# Patient Record
Sex: Male | Born: 1985 | Race: White | Hispanic: No | Marital: Single | Smoking: Never smoker
Health system: Southern US, Community
[De-identification: ages and names within clinical notes are randomized; demographics above are authoritative.]

## PROBLEM LIST (undated history)

## (undated) DIAGNOSIS — M549 Dorsalgia, unspecified: Secondary | ICD-10-CM

## (undated) DIAGNOSIS — G8929 Other chronic pain: Secondary | ICD-10-CM

## (undated) DIAGNOSIS — F1110 Opioid abuse, uncomplicated: Secondary | ICD-10-CM

## (undated) HISTORY — PX: NO PAST SURGERIES: SHX2092

## (undated) HISTORY — PX: WISDOM TOOTH EXTRACTION: SHX21

## (undated) HISTORY — DX: Other chronic pain: G89.29

## (undated) HISTORY — DX: Dorsalgia, unspecified: M54.9

---

## 2011-03-16 ENCOUNTER — Emergency Department (HOSPITAL_COMMUNITY)
Admission: EM | Admit: 2011-03-16 | Discharge: 2011-03-16 | Disposition: A | Discharge status: Home / Self Care | Payer: Self-pay | Attending: Emergency Medicine | Admitting: Emergency Medicine

## 2011-03-16 DIAGNOSIS — K089 Disorder of teeth and supporting structures, unspecified: Secondary | ICD-10-CM | POA: Insufficient documentation

## 2011-03-16 DIAGNOSIS — K006 Disturbances in tooth eruption: Secondary | ICD-10-CM | POA: Insufficient documentation

## 2011-07-10 ENCOUNTER — Emergency Department (HOSPITAL_COMMUNITY): Admission: EM | Admit: 2011-07-10 | Discharge: 2011-07-10 | Discharge status: Against Medical Advice | Payer: Self-pay

## 2013-11-04 ENCOUNTER — Encounter (HOSPITAL_COMMUNITY): Payer: Self-pay | Admitting: Emergency Medicine

## 2013-11-04 ENCOUNTER — Emergency Department (HOSPITAL_COMMUNITY)
Admission: EM | Admit: 2013-11-04 | Discharge: 2013-11-04 | Disposition: A | Discharge status: Home / Self Care | Payer: Medicaid Other | Attending: Emergency Medicine | Admitting: Emergency Medicine

## 2013-11-04 DIAGNOSIS — J329 Chronic sinusitis, unspecified: Secondary | ICD-10-CM | POA: Insufficient documentation

## 2013-11-04 DIAGNOSIS — M546 Pain in thoracic spine: Secondary | ICD-10-CM | POA: Insufficient documentation

## 2013-11-04 DIAGNOSIS — R1110 Vomiting, unspecified: Secondary | ICD-10-CM | POA: Insufficient documentation

## 2013-11-04 DIAGNOSIS — G8929 Other chronic pain: Secondary | ICD-10-CM | POA: Insufficient documentation

## 2013-11-04 MED ORDER — AMOXICILLIN 500 MG PO CAPS
500.0000 mg | ORAL_CAPSULE | Freq: Once | ORAL | Status: AC
  Administered 2013-11-04: 500 mg via ORAL
  Filled 2013-11-04: qty 1

## 2013-11-04 MED ORDER — AMOXICILLIN 500 MG PO CAPS: 500.0000 mg | ORAL_CAPSULE | Freq: Three times a day (TID) | ORAL | Status: DC

## 2013-11-04 MED ORDER — FLUTICASONE PROPIONATE HFA 44 MCG/ACT IN AERO
2.0000 | INHALATION_SPRAY | Freq: Once | RESPIRATORY_TRACT | Status: AC
  Administered 2013-11-04: 2 via RESPIRATORY_TRACT
  Filled 2013-11-04: qty 10.6

## 2013-11-04 MED ORDER — TRAMADOL HCL 50 MG PO TABS
50.0000 mg | ORAL_TABLET | Freq: Four times a day (QID) | ORAL | Status: DC | PRN
Start: 2013-11-04 — End: 2014-05-12

## 2013-11-04 MED ORDER — TRAMADOL HCL 50 MG PO TABS
50.0000 mg | ORAL_TABLET | Freq: Once | ORAL | Status: AC
  Administered 2013-11-04: 50 mg via ORAL
  Filled 2013-11-04: qty 1

## 2013-11-04 NOTE — ED Provider Notes (Signed)
CSN: [id number]     Arrival date & time 11/04/13  2104 History  This chart was scribed for non-physician practitioner Wynetta Emery, PA-C working with Layla Maw Ward, DO by Danella Maiers, ED Scribe. This patient was seen in room TR11C/TR11C and the patient's care was started at 10:01 PM.    Chief Complaint  Patient presents with  . Nasal Congestion   The history is provided by the patient. No language interpreter was used.   HPI Comments: Collin Beasley is a 28 y.o. male who presents to the Emergency Department complaining of gradual-onset, gradually-worsening nasal congestion with associated postnasal drip, rhinorrhea, headache, and sore throat onset last night around 7pm. He reports pain with swallowing. He reports vomiting earlier today. He denies abdominal pain chest pain, shortness of breath.He also reports chronic middle back pain.    History reviewed. No pertinent past medical history. History reviewed. No pertinent past surgical history. No family history on file. History  Substance Use Topics  . Smoking status: Never Smoker   . Smokeless tobacco: Not on file  . Alcohol Use: No    Review of Systems  HENT: Positive for congestion, postnasal drip, rhinorrhea and sore throat.   Gastrointestinal: Positive for vomiting.  Musculoskeletal: Positive for back pain.  Neurological: Positive for headaches.  All other systems reviewed and are negative.     Allergies  Review of patient's allergies indicates no known allergies.  Home Medications   Prior to Admission medications   Not on File   BP 147/82  Pulse 94  Temp(Src) 98.7 F (37.1 C) (Oral)  Resp 16  Ht 6\' 4"  (1.93 m)  Wt 223 lb 12.8 oz (101.515 kg)  BMI 27.25 kg/m2  SpO2 98% Physical Exam  Nursing note and vitals reviewed. Constitutional: He is oriented to person, place, and time. He appears well-developed and well-nourished. No distress.  HENT:  Head: Normocephalic.  Nose: Mucosal edema present.  TTP on  both maxillary and frontal sinuses. Posterior pharynx is injected. Nasal mucosa is edematous and erythematous  Eyes: Conjunctivae and EOM are normal.  Cardiovascular: Normal rate, regular rhythm and normal heart sounds.   No murmur heard. Pulmonary/Chest: Effort normal and breath sounds normal. No stridor. He has no wheezes.  Abdominal: Soft. There is no tenderness.  Musculoskeletal: Normal range of motion.  Neurological: He is alert and oriented to person, place, and time.  Psychiatric: He has a normal mood and affect.    ED Course  Procedures (including critical care time) Medications  fluticasone (FLOVENT HFA) 44 MCG/ACT inhaler 2 puff (2 puffs Inhalation Given 11/04/13 2325)  amoxicillin (AMOXIL) capsule 500 mg (500 mg Oral Given 11/04/13 2324)  traMADol (ULTRAM) tablet 50 mg (50 mg Oral Given 11/04/13 2324)    DIAGNOSTIC STUDIES: Oxygen Saturation is 98% on RA, normal by my interpretation.    COORDINATION OF CARE: 10:52 PM- Discussed treatment plan with pt which includes discharge home with amoxicillin and ultram. Pt agrees to plan.    Labs Review Labs Reviewed - No data to display  Imaging Review No results found.   EKG Interpretation None      MDM   Final diagnoses:  Sinusitis    Filed Vitals:   11/04/13 2115 11/04/13 2326  BP: 147/82 147/82  Pulse: 94 83  Temp: 98.7 F (37.1 C) 97.8 F (36.6 C)  TempSrc: Oral Oral  Resp: 16 18  Height: 6\' 4"  (1.93 m)   Weight: 223 lb 12.8 oz (101.515 kg)   SpO2: 98% 98%  Medications  fluticasone (FLOVENT HFA) 44 MCG/ACT inhaler 2 puff (2 puffs Inhalation Given 11/04/13 2325)  amoxicillin (AMOXIL) capsule 500 mg (500 mg Oral Given 11/04/13 2324)  traMADol (ULTRAM) tablet 50 mg (50 mg Oral Given 11/04/13 2324)    Collin Beasley is a 28 y.o. male presenting with constellation of symptoms consistent with acute sinusitis. Patient's will be treated as such, encourage to follow with primary care.  Evaluation does not show  pathology that would require ongoing emergent intervention or inpatient treatment. Pt is hemodynamically stable and mentating appropriately. Discussed findings and plan with patient/guardian, who agrees with care plan. All questions answered. Return precautions discussed and outpatient follow up given.   Discharge Medication List as of 11/04/2013 10:57 PM    START taking these medications   Details  amoxicillin (AMOXIL) 500 MG capsule Take 1 capsule (500 mg total) by mouth 3 (three) times daily., Starting 11/04/2013, Until Discontinued, Print    traMADol (ULTRAM) 50 MG tablet Take 1 tablet (50 mg total) by mouth every 6 (six) hours as needed., Starting 11/04/2013, Until Discontinued, Print        Note: Portions of this report may have been transcribed using voice recognition software. Every effort was made to ensure accuracy; however, inadvertent computerized transcription errors may be present   I personally performed the services described in this documentation, which was scribed in my presence. The recorded information has been reviewed and is accurate.   Wynetta Emeryicole Gregg Winchell, PA-C 11/05/13 [phone]

## 2013-11-04 NOTE — Discharge Instructions (Signed)
For pain control you may take:  800mg  of ibuprofen (that is usually 4 over the counter pills)  3 times a day (take with food) and acetaminophen 975mg  (this is 3 over the counter pills) four times a day. Do not drink alcohol or combine with other medications that have acetaminophen as an ingredient (Read the labels!).  For breakthrough pain you may take Tramadol. Do not drink alcohol drive or operate heavy machinery when taking Tramadol.  Use nasal saline (you can try [employer] Simply Saline) at least 4 times a day, use saline 5-10 minutes before using the fluticasone (flonase)  Do not use Afrin (Oxymetazoline)  Rest, wash hands frequently  and drink plenty of water.  Do not hesitate to return to the Emergency Department for any new, worsening or concerning symptoms.   If you do not have a primary care doctor you can establish one at the   [facility]: [address] [place] [address] [phone]  After you establish care. Let them know you were seen in the emergency room. They must obtain records for further management.    Sinusitis Sinusitis is redness, soreness, and swelling (inflammation) of the paranasal sinuses. Paranasal sinuses are air pockets within the bones of your face (beneath the eyes, the middle of the forehead, or above the eyes). In healthy paranasal sinuses, mucus is able to drain out, and air is able to circulate through them by way of your nose. However, when your paranasal sinuses are inflamed, mucus and air can become trapped. This can allow bacteria and other germs to grow and cause infection. Sinusitis can develop quickly and last only a short time (acute) or continue over a long period (chronic). Sinusitis that lasts for more than 12 weeks is considered chronic.  CAUSES  Causes of sinusitis include:  Allergies.  Structural abnormalities, such as displacement of the cartilage that separates your nostrils (deviated septum), which can  decrease the air flow through your nose and sinuses and affect sinus drainage.  Functional abnormalities, such as when the small hairs (cilia) that line your sinuses and help remove mucus do not work properly or are not present. SYMPTOMS  Symptoms of acute and chronic sinusitis are the same. The primary symptoms are pain and pressure around the affected sinuses. Other symptoms include:  Upper toothache.  Earache.  Headache.  Bad breath.  Decreased sense of smell and taste.  A cough, which worsens when you are lying flat.  Fatigue.  Fever.  Thick drainage from your nose, which often is green and may contain pus (purulent).  Swelling and warmth over the affected sinuses. DIAGNOSIS  Your caregiver will perform a physical exam. During the exam, your caregiver may:  Look in your nose for signs of abnormal growths in your nostrils (nasal polyps).  Tap over the affected sinus to check for signs of infection.  View the inside of your sinuses (endoscopy) with a special imaging device with a light attached (endoscope), which is inserted into your sinuses. If your caregiver suspects that you have chronic sinusitis, one or more of the following tests may be recommended:  Allergy tests.  Nasal culture A sample of mucus is taken from your nose and sent to a lab and screened for bacteria.  Nasal cytology A sample of mucus is taken from your nose and examined by your caregiver to determine if your sinusitis is related to an allergy. TREATMENT  Most cases of acute sinusitis are related to a viral infection  and will resolve on their own within 10 days. Sometimes medicines are prescribed to help relieve symptoms (pain medicine, decongestants, nasal steroid sprays, or saline sprays).  However, for sinusitis related to a bacterial infection, your caregiver will prescribe antibiotic medicines. These are medicines that will help kill the bacteria causing the infection.  Rarely, sinusitis is  caused by a fungal infection. In theses cases, your caregiver will prescribe antifungal medicine. For some cases of chronic sinusitis, surgery is needed. Generally, these are cases in which sinusitis recurs more than 3 times per year, despite other treatments. HOME CARE INSTRUCTIONS   Drink plenty of water. Water helps thin the mucus so your sinuses can drain more easily.  Use a humidifier.  Inhale steam 3 to 4 times a day (for example, sit in the bathroom with the shower running).  Apply a warm, moist washcloth to your face 3 to 4 times a day, or as directed by your caregiver.  Use saline nasal sprays to help moisten and clean your sinuses.  Take over-the-counter or prescription medicines for pain, discomfort, or fever only as directed by your caregiver. SEEK IMMEDIATE MEDICAL CARE IF:  You have increasing pain or severe headaches.  You have nausea, vomiting, or drowsiness.  You have swelling around your face.  You have vision problems.  You have a stiff neck.  You have difficulty breathing. MAKE SURE YOU:   Understand these instructions.  Will watch your condition.  Will get help right away if you are not doing well or get worse. Document Released: 05/28/2005 Document Revised: 08/20/2011 Document Reviewed: 06/12/2011 [facility] Patient Information 2014 [place], [place].

## 2013-11-04 NOTE — ED Notes (Signed)
Pt. reports nasal congestion / sinus pressure with post nasal drip onset today .  Pt. also stated chronic low back pain for several months denies injury or fall .

## 2013-11-07 NOTE — ED Provider Notes (Signed)
Medical screening examination/treatment/procedure(s) were performed by non-physician practitioner and as supervising physician I was immediately available for consultation/collaboration.   EKG Interpretation None        Chau Sawin N Garnell Phenix, DO 11/07/13 0712 

## 2014-05-05 ENCOUNTER — Emergency Department (HOSPITAL_COMMUNITY)
Admission: EM | Admit: 2014-05-05 | Discharge: 2014-05-05 | Disposition: A | Discharge status: Home / Self Care | Payer: Medicaid Other | Attending: Emergency Medicine | Admitting: Emergency Medicine

## 2014-05-05 ENCOUNTER — Emergency Department (HOSPITAL_COMMUNITY): Payer: Medicaid Other

## 2014-05-05 ENCOUNTER — Encounter (HOSPITAL_COMMUNITY): Payer: Self-pay | Admitting: *Deleted

## 2014-05-05 DIAGNOSIS — R079 Chest pain, unspecified: Secondary | ICD-10-CM | POA: Insufficient documentation

## 2014-05-05 DIAGNOSIS — R002 Palpitations: Secondary | ICD-10-CM | POA: Insufficient documentation

## 2014-05-05 LAB — CBC
HCT: 44.9 % (ref 39.0–52.0)
Hemoglobin: 15.5 g/dL (ref 13.0–17.0)
MCH: 28.4 pg (ref 26.0–34.0)
MCHC: 34.5 g/dL (ref 30.0–36.0)
MCV: 82.2 fL (ref 78.0–100.0)
Platelets: 264 10*3/uL (ref 150–400)
RBC: 5.46 MIL/uL (ref 4.22–5.81)
RDW: 12.2 % (ref 11.5–15.5)
WBC: 8.7 10*3/uL (ref 4.0–10.5)

## 2014-05-05 LAB — BASIC METABOLIC PANEL
ANION GAP: 12 (ref 5–15)
BUN: 17 mg/dL (ref 6–23)
CO2: 27 meq/L (ref 19–32)
Calcium: 9.5 mg/dL (ref 8.4–10.5)
Chloride: 100 mEq/L (ref 96–112)
Creatinine, Ser: 1.05 mg/dL (ref 0.50–1.35)
GFR calc non Af Amer: >90 mL/min (ref >90)
Glucose, Bld: 109 mg/dL — ABNORMAL HIGH (ref 70–99)
POTASSIUM: 4.7 meq/L (ref 3.7–5.3)
SODIUM: 139 meq/L (ref 137–147)

## 2014-05-05 LAB — I-STAT TROPONIN, ED: TROPONIN I, POC: 0 ng/mL (ref 0.00–0.08)

## 2014-05-05 MED ORDER — GI COCKTAIL ~~LOC~~
30.0000 mL | Freq: Once | ORAL | Status: AC
  Administered 2014-05-05: 30 mL via ORAL
  Filled 2014-05-05: qty 30

## 2014-05-05 NOTE — Discharge Instructions (Signed)
Please follow up with your primary care physician in 1-2 days. If you do not have one [facility] listed above. Please follow up with the cardiology office to schedule a follow up appointment.  Please read all discharge instructions and return precautions.   Palpitations A palpitation is the feeling that your heartbeat is irregular or is faster than normal. It may feel like your heart is fluttering or skipping a beat. Palpitations are usually not a serious problem. However, in some cases, you may need further medical evaluation. CAUSES  Palpitations can be caused by:  Smoking.  Caffeine or other stimulants, such as diet pills or energy drinks.  Alcohol.  Stress and anxiety.  Strenuous physical activity.  Fatigue.  Certain medicines.  Heart disease, especially if you have a history of irregular heart rhythms (arrhythmias), such as atrial fibrillation, atrial flutter, or supraventricular tachycardia.  An improperly working pacemaker or defibrillator. DIAGNOSIS  To find the cause of your palpitations, your health care provider will take your medical history and perform a physical exam. Your health care provider may also have you take a test called an ambulatory electrocardiogram (ECG). An ECG records your heartbeat patterns over a 24-hour period. You may also have other tests, such as:  Transthoracic echocardiogram (TTE). During echocardiography, sound waves are used to evaluate how blood flows through your heart.  Transesophageal echocardiogram (TEE).  Cardiac monitoring. This allows your health care provider to monitor your heart rate and rhythm in real time.  Holter monitor. This is a portable device that records your heartbeat and can help diagnose heart arrhythmias. It allows your health care provider to track your heart activity for several days, if needed.  Stress tests by exercise or by giving medicine that makes the heart beat  faster. TREATMENT  Treatment of palpitations depends on the cause of your symptoms and can vary greatly. Most cases of palpitations do not require any treatment other than time, relaxation, and monitoring your symptoms. Other causes, such as atrial fibrillation, atrial flutter, or supraventricular tachycardia, usually require further treatment. HOME CARE INSTRUCTIONS   Avoid:  Caffeinated coffee, tea, soft drinks, diet pills, and energy drinks.  Chocolate.  Alcohol.  Stop smoking if you smoke.  Reduce your stress and anxiety. Things that can help you relax include:  A method of controlling things in your body, such as your heartbeats, with your mind (biofeedback).  Yoga.  Meditation.  Physical activity such as swimming, jogging, or walking.  Get plenty of rest and sleep. SEEK MEDICAL CARE IF:   You continue to have a fast or irregular heartbeat beyond 24 hours.  Your palpitations occur more often. SEEK IMMEDIATE MEDICAL CARE IF:  You have chest pain or shortness of breath.  You have a severe headache.  You feel dizzy or you faint. MAKE SURE YOU:  Understand these instructions.  Will watch your condition.  Will get help right away if you are not doing well or get worse. Document Released: 05/25/2000 Document Revised: 06/02/2013 Document Reviewed: 07/27/2011 [facility] Patient Information 2015 [place], [place]. This information is not intended to replace advice given to you by your health care provider. Make sure you discuss any questions you have with your health care provider.

## 2014-05-05 NOTE — ED Provider Notes (Signed)
CSN: [id number]     Arrival date & time 05/05/14  1438 History   First MD Initiated Contact with Patient 05/05/14 1515     Chief Complaint  Patient presents with  . Palpitations  . Chest Pain     (Consider location/radiation/quality/duration/timing/severity/associated sxs/prior Treatment) HPI Comments: Patient 28 year old male past medical history significant for opiate abuse presenting to the emergency department for 1-2 days of intermittent palpitations. Patient states his palpitations were initially intermittent but have become more constant. He endorses associated central chest discomfort with palpitations. Despite triage note denies any lightheadedness or shortness of breath. He denies any nausea, vomiting, diarrhea, fever, chills. Patient denies any increased caffeine use, energy drink use, alcohol or recreational drug use. PERC negative.   Patient is a 28 y.o. male presenting with palpitations and chest pain.  Palpitations Associated symptoms: chest pain   Chest Pain Associated symptoms: palpitations     History reviewed. No pertinent past medical history. History reviewed. No pertinent past surgical history. History reviewed. No pertinent family history. History  Substance Use Topics  . Smoking status: Never Smoker   . Smokeless tobacco: Not on file  . Alcohol Use: No    Review of Systems  Cardiovascular: Positive for chest pain and palpitations.  All other systems reviewed and are negative.     Allergies  Review of patient's allergies indicates no known allergies.  Home Medications   Prior to Admission medications   Medication Sig Start Date End Date Taking? Authorizing Provider  Hydrocodone-Acetaminophen (VICODIN PO) Take 1 tablet by mouth daily as needed (for pain).   Yes Historical Provider, MD  amoxicillin (AMOXIL) 500 MG capsule Take 1 capsule (500 mg total) by mouth 3 (three) times daily. Patient not taking: Reported on 05/05/2014 11/04/13   Joni ReiningNicole  Pisciotta, PA-C  traMADol (ULTRAM) 50 MG tablet Take 1 tablet (50 mg total) by mouth every 6 (six) hours as needed. Patient not taking: Reported on 05/05/2014 11/04/13   Joni ReiningNicole Pisciotta, PA-C   BP 135/67 mmHg  Pulse 74  Temp(Src) 98.6 F (37 C) (Oral)  Resp 16  Ht 6\' 4"  (1.93 m)  Wt 210 lb (95.255 kg)  BMI 25.57 kg/m2  SpO2 97% Physical Exam  Constitutional: He is oriented to person, place, and time. He appears well-developed and well-nourished. No distress.  HENT:  Head: Normocephalic and atraumatic.  Right Ear: External ear normal.  Left Ear: External ear normal.  Nose: Nose normal.  Mouth/Throat: Oropharynx is clear and moist. No oropharyngeal exudate.  Eyes: Conjunctivae are normal.  Neck: Neck supple.  Cardiovascular: Normal rate, regular rhythm, normal heart sounds and intact distal pulses.   Pulmonary/Chest: Effort normal and breath sounds normal. No respiratory distress. He exhibits no tenderness.  Abdominal: Soft. There is no tenderness.  Musculoskeletal: Normal range of motion. He exhibits no edema.  Lymphadenopathy:    He has no cervical adenopathy.  Neurological: He is alert and oriented to person, place, and time.  Skin: Skin is warm and dry. He is not diaphoretic.  Nursing note and vitals reviewed.   ED Course  Procedures (including critical care time) Medications  gi cocktail (Maalox,Lidocaine,Donnatal) (30 mLs Oral Given 05/05/14 1642)    Labs Review Labs Reviewed  BASIC METABOLIC PANEL - Abnormal; Notable for the following:    Glucose, Bld 109 (*)    All other components within normal limits  CBC  I-STAT TROPOININ, ED    Imaging Review Dg Chest 2 View  05/05/2014   CLINICAL DATA:  Chest  pain for 3 days with shortness of Breath  EXAM: CHEST  2 VIEW  COMPARISON:  None.  FINDINGS: The heart and pulmonary vascularity are within normal limits. The lungs are clear bilaterally. The bony structures demonstrate changes of prior left clavicular fracture with  healing. No other focal abnormality is seen.  IMPRESSION: No active cardiopulmonary disease.   Electronically Signed   By: Alcide CleverMark  Lukens M.D.   On: 05/05/2014 16:16     EKG Interpretation   Date/Time:  Wednesday May 05 2014 17:00:18 EST Ventricular Rate:  73 PR Interval:  162 QRS Duration: 88 QT Interval:  356 QTC Calculation: 392 R Axis:   83 Text Interpretation:  Sinus rhythm No previous ECGs available Confirmed by  RANCOUR  MD, STEPHEN [phone]([id number]) on 05/05/2014 5:03:38 PM      MDM   Final diagnoses:  Heart palpitations    Filed Vitals:   05/05/14 1728  BP:   Pulse:   Temp: 98.6 F (37 C)  Resp:    Afebrile, NAD, non-toxic appearing, AAOx4.  I have reviewed nursing notes, vital signs, and all appropriate lab and imaging results for this patient. Patient with heart palpitations. EKG is unremarkable. Chest x-ray unremarkable. Serum labs unremarkable. Return precautions discussed. Advised patient follow up with cardiology for possible Holter monitoring. Patient is agreeable to plan. Patient is stable at time of discharge. Patient d/w with Dr. Manus Gunningancour, agrees with plan.         Jeannetta EllisJennifer L Nussen Pullin, PA-C 05/06/14 0026  Glynn OctaveStephen Rancour, MD 05/06/14 [phone]

## 2014-05-05 NOTE — ED Notes (Signed)
Pt reports having an upper chest discomfort for several days with intermittent palpitations. Reports feeling lightheaded and sob. ekg done at triage and airway is intact.

## 2014-05-12 ENCOUNTER — Ambulatory Visit (INDEPENDENT_AMBULATORY_CARE_PROVIDER_SITE_OTHER): Payer: Medicaid Other | Admitting: Interventional Cardiology

## 2014-05-12 ENCOUNTER — Encounter: Payer: Self-pay | Admitting: Interventional Cardiology

## 2014-05-12 VITALS — BP 152/78 | HR 90 | Ht 76.0 in | Wt 232.8 lb

## 2014-05-12 DIAGNOSIS — R002 Palpitations: Secondary | ICD-10-CM

## 2014-05-12 DIAGNOSIS — R079 Chest pain, unspecified: Secondary | ICD-10-CM

## 2014-05-12 NOTE — Patient Instructions (Signed)
Your physician recommends that you continue on your current medications as directed. Please refer to the Current Medication list given to you today.    Your physician has requested that you have an exercise tolerance test. For further information please visit [email or web address]. Please also follow instruction sheet, as given.    Your physician recommends that you schedule a follow-up appointment in: AS NEEDED WITH DR Eldridge DaceVARANASI

## 2014-05-12 NOTE — Progress Notes (Signed)
Patient ID: Collin Beasley, male   DOB: [birth year], 28 y.o.   MRN: [id number]     Patient ID: Collin Salthomas Loring MRN: [id number] DOB/AGE: [birth year] 29 y.o.   [facility]   Reason for Consultation  Chest pain  HPI: 28 y/o who has had some chest pain.  It occured initially after having intercourse.  He felt his heart racing after intercourse and then noticed some discomfort in the center of his chest.  He has had this feeling before when he has emotional stress.  He has had relief when he takes a small dose of Xanax from his friend.  No CP with walking through the grocery store.  No  Smoking.  No early h/o CAD.  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  Will sometimes use narcotics "if they are around."   Current Outpatient Prescriptions  Medication Sig Dispense Refill  . Hydrocodone-Acetaminophen (VICODIN PO) Take 1 tablet by mouth daily as needed (for pain).     No current facility-administered medications for this visit.   History reviewed. No pertinent past medical history.  History reviewed. No pertinent family history.  History   Social History  . Marital Status: Single    Spouse Name: N/A    Number of Children: N/A  . Years of Education: N/A   Occupational History  . Not on file.   Social History Main Topics  . Smoking status: Never Smoker   . Smokeless tobacco: Not on file  . Alcohol Use: No  . Drug Use: Yes     Comment: reports being addicted to opiate  . Sexual Activity: Not on file   Other Topics Concern  . Not on file   Social History Narrative    History reviewed. No pertinent past surgical history.    (Not in a hospital admission)  Review of systems complete and found to be negative unless listed above .  No nausea, vomiting.  No fever chills, No focal weakness,  No palpitations.  Physical Exam: Filed Vitals:   05/12/14 1555  BP: 152/78  Pulse: 90    Weight: 232 lb 12.8 oz (105.597 kg)  Physical  exam:  [place]/AT EOMI No JVD, No carotid bruit RRR S1S2  No wheezing Soft. NT, nondistended No edema. No focal motor or sensory deficits Anxious affect Skin: multiple tattoos  Labs:   Lab Results  Component Value Date   WBC 8.7 05/05/2014   HGB 15.5 05/05/2014   HCT 44.9 05/05/2014   MCV 82.2 05/05/2014   PLT 264 05/05/2014   No results for input(s): NA, K, CL, CO2, BUN, CREATININE, CALCIUM, PROT, BILITOT, ALKPHOS, ALT, AST, GLUCOSE in the last 168 hours.  Invalid input(s): LABALBU No results found for: CKTOTAL, CKMB, CKMBINDEX, TROPONINI No results found for: CHOL No results found for: HDL No results found for: LDLCALC No results found for: TRIG No results found for: CHOLHDL No results found for: LDLDIRECT    Radiology: No active cardiopulmonary disease EKG: Normal  ASSESSMENT AND PLAN:  Atypical chest pain: We'll plan for exercise treadmill test. Low likelihood of ischemia as cause of his pain. I think it is more likely due to emotional stress. He is otherwise healthy. He does need more cardiovascular exercise. Will try to improve diet as well.  Since his palpitations come on with exertion, we should be able to see what happens with his heart rhythm on the treadmill.   Signed:   Fredric MareJay S. Madell Heino,  MD, [facility] 05/12/2014, 4:20 PM

## 2014-06-01 ENCOUNTER — Encounter: Payer: Self-pay | Admitting: *Deleted

## 2014-06-07 ENCOUNTER — Encounter: Payer: Medicaid Other | Admitting: Physician Assistant

## 2014-06-07 ENCOUNTER — Encounter: Payer: Self-pay | Admitting: *Deleted

## 2014-06-07 NOTE — Progress Notes (Signed)
Patient ID: Collin Beasley, male   DOB: 30-Sep-1985, 28 y.o.   MRN: [id number] Patient did not show up for 06/07/2014 10:15 am appointment for a Treadmill Stress Test.

## 2014-10-09 ENCOUNTER — Emergency Department (HOSPITAL_COMMUNITY)
Admission: EM | Admit: 2014-10-09 | Discharge: 2014-10-09 | Disposition: A | Discharge status: Home / Self Care | Payer: Medicaid Other | Attending: Emergency Medicine | Admitting: Emergency Medicine

## 2014-10-09 ENCOUNTER — Encounter (HOSPITAL_COMMUNITY): Payer: Self-pay | Admitting: Emergency Medicine

## 2014-10-09 DIAGNOSIS — Z008 Encounter for other general examination: Secondary | ICD-10-CM | POA: Diagnosis present

## 2014-10-09 DIAGNOSIS — F1110 Opioid abuse, uncomplicated: Secondary | ICD-10-CM | POA: Insufficient documentation

## 2014-10-09 DIAGNOSIS — Z79899 Other long term (current) drug therapy: Secondary | ICD-10-CM | POA: Diagnosis not present

## 2014-10-09 DIAGNOSIS — G8929 Other chronic pain: Secondary | ICD-10-CM | POA: Diagnosis not present

## 2014-10-09 HISTORY — DX: Opioid abuse, uncomplicated: F11.10

## 2014-10-09 MED ORDER — DICYCLOMINE HCL 20 MG PO TABS: 20.0000 mg | ORAL_TABLET | Freq: Two times a day (BID) | ORAL | Status: DC

## 2014-10-09 MED ORDER — ONDANSETRON 4 MG PO TBDP
4.0000 mg | ORAL_TABLET | Freq: Once | ORAL | Status: AC
  Administered 2014-10-09: 4 mg via ORAL
  Filled 2014-10-09: qty 1

## 2014-10-09 MED ORDER — ONDANSETRON 4 MG PO TBDP
ORAL_TABLET | ORAL | Status: DC
Start: 2014-10-09 — End: 2017-01-25

## 2014-10-09 MED ORDER — DICYCLOMINE HCL 10 MG PO CAPS
10.0000 mg | ORAL_CAPSULE | Freq: Once | ORAL | Status: AC
  Administered 2014-10-09: 10 mg via ORAL
  Filled 2014-10-09: qty 1

## 2014-10-09 MED ORDER — CLONIDINE HCL 0.1 MG PO TABS: ORAL_TABLET | ORAL | Status: DC

## 2014-10-09 MED ORDER — CLONIDINE HCL 0.1 MG PO TABS
0.1000 mg | ORAL_TABLET | Freq: Once | ORAL | Status: AC
  Administered 2014-10-09: 0.1 mg via ORAL
  Filled 2014-10-09: qty 1

## 2014-10-09 NOTE — ED Provider Notes (Signed)
CSN: [id number]     Arrival date & time 10/09/14  1191 History   First MD Initiated Contact with Patient 10/09/14 [phone]     Chief Complaint  Patient presents with  . Drug Problem  . Medical Clearance     (Consider location/radiation/quality/duration/timing/severity/associated sxs/prior Treatment) HPI Collin Beasley is a 29 y.o. male with a history of heroin abuse comes in for evaluation apparently detox. Patient states he's been using here when every day for over a year. He reports his last use of hair one was yesterday morning at approximately 10 AM. At this time he reports nausea, abdominal pain, restless legs and reports "I feel like crap". He has not tried anything to improve his symptoms. Denies fevers, headache, chest pain, shortness of breath, numbness or weakness, syncope  Would like to be referred to [facility] or other outpatient rehabilitation clinic. No other aggravating or modifying factors. Denies alcohol or benzodiazepine use. No suicidal or homicidal ideation.  Past Medical History  Diagnosis Date  . Chronic back pain     tattoo artist  . Heroin abuse    Past Surgical History  Procedure Laterality Date  . No past surgeries     Family History  Problem Relation Age of Onset  . Heart attack Neg Hx   . Heart failure Neg Hx   . Hypertension Neg Hx   . Stroke Neg Hx   . Sudden death Neg Hx    History  Substance Use Topics  . Smoking status: Never Smoker   . Smokeless tobacco: Never Used  . Alcohol Use: No    Review of Systems A 10 point review of systems was completed and was negative except for pertinent positives and negatives as mentioned in the history of present illness     Allergies  Review of patient's allergies indicates no known allergies.  Home Medications   Prior to Admission medications   Medication Sig Start Date End Date Taking? Authorizing Provider  cloNIDine (CATAPRES) 0.1 MG tablet 1 tab po tid x 2 days, then bid x 2 days, then once daily x 2 days  10/09/14   Joycie Peek, PA-C  dicyclomine (BENTYL) 20 MG tablet Take 1 tablet (20 mg total) by mouth 2 (two) times daily. 10/09/14   Joycie Peek, PA-C  Hydrocodone-Acetaminophen (VICODIN PO) Take 1 tablet by mouth daily as needed (for pain).    Historical Provider, MD  ondansetron (ZOFRAN ODT) 4 MG disintegrating tablet  ODT q4 hours prn nausea/vomit 10/09/14   Noell Shular, PA-C   BP 149/57 mmHg  Pulse 82  Temp(Src) 98.6 F (37 C) (Oral)  Resp 18  Ht  (1.93 m)  Wt 230 lb (104.327 kg)  BMI 28.01 kg/m2  SpO2 96% Physical Exam  Constitutional: He is oriented to person, place, and time. He appears well-developed and well-nourished.  Appears uncomfortable  HENT:  Head: Normocephalic and atraumatic.  Mouth/Throat: Oropharynx is clear and moist. No oropharyngeal exudate.  Eyes: Conjunctivae are normal. Pupils are equal, round, and reactive to light. Right eye exhibits no discharge. Left eye exhibits no discharge. No scleral icterus.  Neck: Normal range of motion. Neck supple. No JVD present.  Cardiovascular: Normal rate, regular rhythm and normal heart sounds.  Exam reveals no gallop and no friction rub.   No murmur heard. Pulmonary/Chest: Effort normal and breath sounds normal. No respiratory distress. He has no wheezes. He has no rales.  Abdominal: Soft. He exhibits no distension and no mass. There is no tenderness. There is  no rebound and no guarding.  Musculoskeletal: He exhibits no tenderness.  Lymphadenopathy:    He has no cervical adenopathy.  Neurological: He is alert and oriented to person, place, and time.  Cranial Nerves II-XII grossly intact  Skin: Skin is warm and dry. No rash noted.  Psychiatric: He has a normal mood and affect.  Nursing note and vitals reviewed.   ED Course  Procedures (including critical care time) Labs Review Labs Reviewed - No data to display  Imaging Review No results found.   EKG Interpretation None     Meds given in  ED:  Medications  ondansetron (ZOFRAN-ODT) disintegrating tablet 4 mg (4 mg Oral Given 10/09/14 0937)  dicyclomine (BENTYL) capsule 10 mg (10 mg Oral Given 10/09/14 0937)  cloNIDine (CATAPRES) tablet 0.1 mg (0.1 mg Oral Given 10/09/14 0937)    Discharge Medication List as of 10/09/2014  9:30 AM    START taking these medications   Details  dicyclomine (BENTYL) 20 MG tablet Take 1 tablet (20 mg total) by mouth 2 (two) times daily., Starting 10/09/2014, Until Discontinued, Print    ondansetron (ZOFRAN ODT) 4 MG disintegrating tablet 4mg  ODT q4 hours prn nausea/vomit, Print       Filed Vitals:   10/09/14 0907 10/09/14 0915 10/09/14 0930 10/09/14 0938  BP: 142/64 112/49 149/57 149/57  Pulse: 85 65 84 82  Temp:    98.6 F (37 C)  TempSrc:    Oral  Resp: 18   18  Height:      Weight:      SpO2: 96% 93% 90% 96%    MDM  Vitals stable - WNL -afebrile Pt resting comfortably in ED. PE--grossly benign physical exam. DDX--requesting outpatient resources for heroin abuse. We'll discharge with Zofran, Bentyl and clonidine taper. Given resource guide for appropriate follow-up.  I discussed all relevant lab findings and imaging results with pt and they verbalized understanding. Discussed f/u with PCP within 48 hrs and return precautions, pt very amenable to plan. Prior to patient discharge, I discussed and reviewed this case with Dr. Rubin PayorPickering  Final diagnoses:  Heroin abuse        Joycie PeekBenjamin Trevino Wyatt, PA-C 10/09/14 1636  Benjiman CoreNathan Pickering, MD 10/10/14 [phone]

## 2014-10-09 NOTE — Discharge Instructions (Signed)
Opioid Withdrawal Opioids are a group of narcotic drugs. They include the street drug heroin. They also include pain medicines, such as morphine, hydrocodone, oxycodone, and fentanyl. Opioid withdrawal is a group of characteristic physical and mental signs and symptoms. It typically occurs if you have been using opioids daily for several weeks or longer and stop using or rapidly decrease use. Opioid withdrawal can also occur if you have used opioids daily for a long time and are given a medicine to block the effect.  SIGNS AND SYMPTOMS Opioid withdrawal includes three or more of the following symptoms:   Depressed, anxious, or irritable mood.  Nausea or vomiting.  Muscle aches or spasms.   Watery eyes.   Runny nose.  Dilated pupils, sweating, or hairs standing on end.  Diarrhea or intestinal cramping.  Yawning.   Fever.  Increased blood pressure.  Fast pulse.  Restlessness or trouble sleeping. These signs and symptoms occur within several hours of stopping or reducing short-acting opioids, such as heroin. They can occur within 3 days of stopping or reducing long-acting opioids, such as methadone. Withdrawal begins within minutes of receiving a drug that blocks the effects of opioids, such as naltrexone or naloxone. DIAGNOSIS  Opioid use disorder is diagnosed by your health care provider. You will be asked about your symptoms, drug and alcohol use, medical history, and use of medicines. A physical exam may be done. Lab tests may be ordered. Your health care provider may have you see a mental health professional.  TREATMENT  The treatment for opioid withdrawal is usually provided by medical doctors with special training in substance use disorders (addiction specialists). The following medicines may be included in treatment:  Opioids given in place of the abused opioid. They turn on opioid receptors in the brain and lessen or prevent withdrawal symptoms. They are gradually  decreased (opioid substitution and taper).  Non-opioids that can lessen certain opioid withdrawal symptoms. They may be used alone or with opioid substitution and taper. Successful long-term recovery usually requires medicine, counseling, and group support. HOME CARE INSTRUCTIONS   Take medicines only as directed by your health care provider.  Check with your health care provider before starting new medicines.  Keep all follow-up visits as directed by your health care provider. SEEK MEDICAL CARE IF:  You are not able to take your medicines as directed.  Your symptoms get worse.  You relapse. SEEK IMMEDIATE MEDICAL CARE IF:  You have serious thoughts about hurting yourself or others.  You have a seizure.  You lose consciousness. Document Released: 05/31/2003 Document Revised: 10/12/2013 Document Reviewed: 06/10/2013 [facility] Patient Information 2015 [place], [place]. This information is not intended to replace advice given to you by your health care provider. Make sure you discuss any questions you have with your health care provider.  Polysubstance Abuse When people abuse more than one drug or type of drug it is called polysubstance or polydrug abuse. For example, many smokers also drink alcohol. This is one form of polydrug abuse. Polydrug abuse also refers to the use of a drug to counteract an unpleasant effect produced by another drug. It may also be used to help with withdrawal from another drug. People who take stimulants may become agitated. Sometimes this agitation is countered with a tranquilizer. This helps protect against the unpleasant side effects. Polydrug abuse also refers to the use of different drugs at the same time.  Anytime drug use is interfering with normal living activities, it has become abuse. This includes problems  with family and friends. Psychological dependence has developed when your mind tells you that the drug is needed. This is usually followed by physical  dependence which has developed when continuing increases of drug are required to get the same feeling or "high". This is known as addiction or chemical dependency. A person's risk is much higher if there is a history of chemical dependency in the family. SIGNS OF CHEMICAL DEPENDENCY  You have been told by friends or family that drugs have become a problem.  You fight when using drugs.  You are having blackouts (not remembering what you do while using).  You feel sick from using drugs but continue using.  You lie about use or amounts of drugs (chemicals) used.  You need chemicals to get you going.  You are suffering in work performance or in school because of drug use.  You get sick from use of drugs but continue to use anyway.  You need drugs to relate to people or feel comfortable in social situations.  You use drugs to forget problems. "Yes" answered to any of the above signs of chemical dependency indicates there are problems. The longer the use of drugs continues, the greater the problems will become. If there is a family history of drug or alcohol use, it is best not to experiment with these drugs. Continual use leads to tolerance. After tolerance develops more of the drug is needed to get the same feeling. This is followed by addiction. With addiction, drugs become the most important part of life. It becomes more important to take drugs than participate in the other usual activities of life. This includes relating to friends and family. Addiction is followed by dependency. Dependency is a condition where drugs are now needed not just to get high, but to feel normal. Addiction cannot be cured but it can be stopped. This often requires outside help and the care of professionals. Treatment centers are listed in the yellow pages under: Cocaine, Narcotics, and Alcoholics Anonymous. Most hospitals and clinics can refer you to a specialized care center. Talk to your caregiver if you need  help. Document Released: 01/17/2005 Document Revised: 08/20/2011 Document Reviewed: 05/28/2005 [facility] Patient Information 2015 [place], [place]. This information is not intended to replace advice given to you by your health care provider. Make sure you discuss any questions you have with your health care provider.   Emergency Department Resource Guide 1) Find a Doctor and Pay Out of Pocket Although you won't have to find out who is covered by your insurance plan, it is a good idea to ask around and get recommendations. You will then need to call the office and see if the doctor you have chosen will accept you as a new patient and what types of options they offer for patients who are self-pay. Some doctors offer discounts or will set up payment plans for their patients who do not have insurance, but you will need to ask so you aren't surprised when you get to your appointment.  2) Contact Your Local Health Department Not all health departments have doctors that can see patients for sick visits, but many do, so it is worth a call to see if yours does. If you don't know where your local health department is, you can check in your phone book. The CDC also has a tool to help you locate your state's health department, and many state websites also have listings of all of their local health departments.  3) Find a  Walk-in Clinic If your illness is not likely to be very severe or complicated, you may want to try a walk in clinic. These are popping up all over the country in pharmacies, drugstores, and shopping centers. They're usually staffed by nurse practitioners or physician assistants that have been trained to treat common illnesses and complaints. They're usually fairly quick and inexpensive. However, if you have serious medical issues or chronic medical problems, these are probably not your best option.  No Primary Care Doctor: - Call Health Connect at  [phone] - they can help you locate a primary  care doctor that  accepts your insurance, provides certain services, etc. - Physician Referral Service- [phone]  Chronic Pain Problems: Organization         Address  Phone   Notes  [facility]  [phone] Patients need to be referred by their primary care doctor.   Medication Assistance: Organization         Address  Phone   Notes  [facility] Medication [facility] [address] [place] [place], [place] [address] [phone] --Must be a resident of [place] -- Must have NO insurance coverage whatsoever (no Medicaid/ Medicare, etc.) -- The pt. MUST have a primary care doctor that directs their care regularly and follows them in the community   MedAssist  [phone]   [employer]  [phone]    Agencies that provide inexpensive medical care: Organization         Address  Phone   Notes  [facility]  [phone]   [facility]    [phone]   [facility] [address] [place], [place] [address] [phone]   [facility] [address] [place], [place] [phone]   Planned Parenthood    [phone]   [facility]    [phone]   Community Health and [facility]  [address], [place] Phone:  [phone], Fax:  [phone] Hours of Operation:  9 am - 6 pm, M-F.  Also accepts Medicaid/Medicare and self-pay.  [facility]  [address], [place] Phone: [phone], Fax: [phone]. Hours of Operation:  8:30 am - 5:30 pm, M-F.  Also accepts Medicaid and self-pay.  [facility] [place] [address], [place] Phone: [phone]   [facility] [address] Natasha Bence [place], [place] [phone], Ext. [phone] Mondays & Thursdays: 7-9 AM.  First 15 patients are seen on a first come, first serve basis.    Medicaid-accepting [facility]  Providers:  Organization         Address  Phone   Notes  [facility] [address], [place] [phone] Also accepts self-pay patients.  [facility] [address] Laurell Josephs [place], [place]  [phone]   [facility] [address], [place] [phone]   [facility] [address] [place] [phone]   Renaye Rakers [address], [place]   [phone] Only accepts [place] Access [place] patients after they have their name applied to their card.   Self-Pay (no insurance) in [facility]:  Organization         Address  Phone   Notes  Sickle Cell Patients, [facility] Internal Medicine [address] [place],  [place] [phone]   [facility]gent Care [place] [phone]   [facility] [place]  [place], [place], [place] [phone]   [facility]/Dr. Osei-Bonsu  [address] [place] or [address], [place] [phone] Phone number for both [place] and [place] locations is the same.  Urgent Medical and [facility] [address] [place] [phone]   [facility] [address], [place] or [address] [phone]   [facility] [address], [place] [phone], phone; [phone], fax Sees patients 1st and 3rd Saturday of every month.  Must not qualify for public or private insurance (i.e. Medicaid, Medicare, [place] Health Choice, Veterans' Benefits)  Household income should be no more than 200% of the poverty level The clinic cannot treat you if you are pregnant or think you are pregnant  Sexually transmitted diseases are not treated at the clinic.    Dental Care: Organization         Address  Phone  Notes  [facility] Department of [facility] [place] [phone] Accepts children up to age 29 who are enrolled in [place] or [place]; pregnant women with a Medicaid card; and children who have applied for Medicaid or [place] Health Choice, but were declined, whose parents can pay a reduced fee at time of service.  [facility] Department of [facility]  [address], [place] [phone] Accepts children up to age 74 who are enrolled in [place] or [place]; pregnant women with a Medicaid card; and children who have applied for Medicaid or [place] Health Choice, but were declined, whose parents can pay a reduced fee at time of service.  [place] Adult [facility] Access PROGRAM  [place] [phone] Patients are seen by appointment only. Walk-ins are not accepted. [place] will see patients 60 years of age and older. Monday - Tuesday (8am-5pm) Most Wednesdays (8:30-5pm) $30 per visit, cash only  [facility] Adult Dental Access PROGRAM  [address], [facility] [phone] Patients are seen by appointment only. Walk-ins are not accepted. [place] will see patients 67 years of age and older. One Wednesday Evening (Monthly: Volunteer Based).  $30 per visit, cash only  [place]  [phone] for adults; Children under age 37, call Graduate Pediatric Dentistry at [phone]. Children aged 16-14, please call [phone] to request a pediatric application.  Dental services are provided in all areas of dental care including fillings, crowns and bridges, complete and partial dentures, implants, gum treatment, root canals, and extractions. Preventive care is also provided. Treatment is provided to both adults and children. Patients are selected via a lottery and there is often a waiting list.   [facility] [address], [place]  [phone] [email or web address]   [facility]  [address] [place], [place] [phone], Ext. [phone] Second and Fourth Thursday of each month, opens at 6:30 AM; Clinic ends at 9 AM.  Patients are seen on a first-come first-served basis, and a limited number are seen during each clinic.   [facility]  [address] Hillard Danker [place], [place] [phone]   Eligibility Requirements You must have lived in [place], [place], or [place]  counties for at least the last three months.   You cannot be eligible for state or federal sponsored [employer], including [employer], [place], or [employer].   You generally cannot be eligible for healthcare insurance through your employer.    How to apply: Eligibility screenings are held every Tuesday and Wednesday afternoon from 1:00 pm until 4:00 pm. You do not need an appointment for the interview!  [facility] [address] [place], [place]   [place]  [place] Department  [place]  [phone]    Behavioral Health Resources in the Community: Intensive Outpatient Programs Organization         Address  Phone  Notes  [place] [facility] [place]. [address] [place], [place] [phone]   [facility] Outpatient [address] [place], [place]   ADS: Alcohol [facility] Drug [facility] [address] [place], [place]   [place] [address]  [place], [place] or [phone]   Substance Abuse Resources Organization         Address  Phone  Notes  Alcohol and Drug Services  3084070831   [place]  [phone]   [facility] [place]   Chinita Pester  [phone]   Residential & Outpatient Substance Abuse Program  [phone]   Psychological Services Organization         Address  Phone  Notes  [facility] [place]  [place]  [phone]   [place] [address], [place] or [phone]    Mobile Crisis Teams Organization         Address  Phone  Notes  [facility], Mobile Crisis Care Unit  [phone]   [facility]  [address] [place], [place]   [place] [address] [place] [phone]    Self-Help/Support Groups Organization         Address  Phone             Notes  [place]. of [place] - variety of support groups  [place] Call for more information  Narcotics Anonymous (NA), [facility] [address], [employer] [place]  2 meetings at this location   Special educational needs teacher         Address  Phone  Notes  [facility] [place],    [place]  [phone]   [facility]  [address], [place] [phone], [place], [place]   [place], [place] [phone] Admissions: 8am-3pm M-F  Incentives Substance [place] [address]    [place], [place] [phone]   [facility] [address] [place], [place], [place]   [facility] [address],  [place], [place]   [facility] - Intensive Outpatient [address] Kristeen [address], [place], [place]   [facility] [place].,  [place], [place] or [phone]   [facility] ([facility]) [address] [place], [place] Accepts Medicaid  [place] [address]  [place] [phone] Substance Abuse/Addiction Treatment   [facility] Resources Organization         Address  Phone  Notes  Lexicographer Services  (  [phone]   Domenic Schwab, PhD [address] Arlis Porta [place], [place]   [phone] or [phone]    [place], [place] [phone]   [address], [place], [place] [phone] Insurance/Medicaid/sponsorship through [facility] and Families [address] [place]                                    [place], [place] [phone] [place] [address] [place], [place] [phone]    Dr. Adele Schilder  [phone]   [facility] Dept. 1) [address] [place] 2) [place] 3)  [place] [address], [place] [phone]  [phone]   [place] [phone] or [phone] (After Hours)

## 2014-10-09 NOTE — ED Notes (Signed)
Pt reports here for detox from heroin. Last use yesterday morning. Pt denies SI/HI.

## 2015-01-04 ENCOUNTER — Emergency Department (HOSPITAL_COMMUNITY)
Admission: EM | Admit: 2015-01-04 | Discharge: 2015-01-04 | Disposition: A | Discharge status: Home / Self Care | Payer: Medicaid Other | Attending: Emergency Medicine | Admitting: Emergency Medicine

## 2015-01-04 DIAGNOSIS — G8929 Other chronic pain: Secondary | ICD-10-CM | POA: Insufficient documentation

## 2015-01-04 DIAGNOSIS — L910 Hypertrophic scar: Secondary | ICD-10-CM | POA: Insufficient documentation

## 2015-01-04 MED ORDER — NAPROXEN 500 MG PO TABS: 500.0000 mg | ORAL_TABLET | Freq: Two times a day (BID) | ORAL | Status: DC

## 2015-01-04 NOTE — ED Provider Notes (Signed)
CSN: [id number]     Arrival date & time 01/04/15  1605 History   This chart was scribed for non-physician practitioner, Everlene Farrier, PA-C working with Pricilla Loveless, MD, by Abel Presto, ED Scribe. This patient was seen in room WTR8/WTR8 and the patient's care was started at 6:16 PM.      Chief Complaint  Patient presents with  . Otalgia     The history is provided by the patient. No language interpreter was used.   HPI Comments: Collin Beasley is a 29 y.o. male with PMHx of heroin abuse who presents to the Emergency Department complaining of 8/10 keloids to bilateral ears, worse on the right, with gradual onset 8 months ago. Pt reports he had his ears gauged 8 months ago and states approximately 2 weeks later his ears began to itch and lesions gradually grew. He notes ear lobes are painful and states it is difficult for him to sleep. Pt has not taken any medication for pain. He denies fever, chills, and discharge or ear pain.   Past Medical History  Diagnosis Date  . Chronic back pain     tattoo artist  . Heroin abuse    Past Surgical History  Procedure Laterality Date  . No past surgeries     Family History  Problem Relation Age of Onset  . Heart attack Neg Hx   . Heart failure Neg Hx   . Hypertension Neg Hx   . Stroke Neg Hx   . Sudden death Neg Hx    History  Substance Use Topics  . Smoking status: Never Smoker   . Smokeless tobacco: Never Used  . Alcohol Use: No    Review of Systems  Constitutional: Negative for fever and chills.  HENT: Positive for ear pain. Negative for ear discharge, mouth sores and sore throat.   Skin: Negative for rash.      Allergies  Review of patient's allergies indicates no known allergies.  Home Medications   Prior to Admission medications   Medication Sig Start Date End Date Taking? Authorizing Provider  cloNIDine (CATAPRES) 0.1 MG tablet 1 tab po tid x 2 days, then bid x 2 days, then once daily x 2 days Patient not taking:  Reported on 01/04/2015 10/09/14   Joycie Peek, PA-C  dicyclomine (BENTYL) 20 MG tablet Take 1 tablet (20 mg total) by mouth 2 (two) times daily. Patient not taking: Reported on 01/04/2015 10/09/14   Joycie Peek, PA-C  naproxen (NAPROSYN) 500 MG tablet Take 1 tablet (500 mg total) by mouth 2 (two) times daily with a meal. 01/04/15   Everlene Farrier, PA-C  ondansetron (ZOFRAN ODT) 4 MG disintegrating tablet  ODT q4 hours prn nausea/vomit Patient not taking: Reported on 01/04/2015 10/09/14   Joycie Peek, PA-C   BP 165/89 mmHg  Pulse 83  Temp(Src) 98.4 F (36.9 C) (Oral)  Resp 16  SpO2 96% Physical Exam  Constitutional: He appears well-developed and well-nourished. No distress.  Nontoxic appearing.  HENT:  Head: Normocephalic and atraumatic.  Right Ear: Tympanic membrane normal.  Left Ear: Tympanic membrane normal.  Mouth/Throat: Oropharynx is clear and moist. No oropharyngeal exudate.  Patient has 2 large keloids to his bilateral ear lobes.There is no evidence of cellulitis or erythema. They are clean and dry. No erythema or discharge. Bilateral tympanic membranes are pearly-gray without erythema or loss of landmarks.   Eyes: Conjunctivae are normal. Right eye exhibits no discharge. Left eye exhibits no discharge.  Neck: Neck supple.  Cardiovascular: Normal  rate, regular rhythm and intact distal pulses.   Pulmonary/Chest: Effort normal. No respiratory distress.  Abdominal: Soft. There is no tenderness.  Lymphadenopathy:    He has no cervical adenopathy.  Neurological: He is alert. Coordination normal.  Skin: Skin is warm and dry. No rash noted. He is not diaphoretic. No erythema. No pallor.  Psychiatric: He has a normal mood and affect. His behavior is normal.  Nursing note and vitals reviewed.   ED Course  Procedures (including critical care time) DIAGNOSTIC STUDIES: Oxygen Saturation is 96% on room air, normal by my interpretation.    COORDINATION OF CARE: 6:20 PM  Discussed treatment plan with patient at beside, the patient agrees with the plan and has no further questions at this time.   Labs Review Labs Reviewed - No data to display  Imaging Review No results found.   EKG Interpretation None      Filed Vitals:   01/04/15 1646  BP: 165/89  Pulse: 83  Temp: 98.4 F (36.9 C)  TempSrc: Oral  Resp: 16  SpO2: 96%     MDM   Meds given in ED:  Medications - No data to display  New Prescriptions   NAPROXEN (NAPROSYN) 500 MG TABLET    Take 1 tablet (500 mg total) by mouth 2 (two) times daily with a meal.    Final diagnoses:  Keloid   This is a 29 year old male presents the emergency department complaining of keloids his bilateral earlobes for the past 8 months.Patient reports he had his years gauged approximately 8 months ago andhas had gradual onset of his keloids. He reports they're painful. He has taken nothing for pain today. On exam the patient has keloids his bilateral earlobes. They're clean and dry. There is no evidence of cellulitis or abscess. There is no drainage. His bilateral tympanic membranes are pearly-gray without erythema or loss of landmarks.Will prescribe the patient naproxen and have him follow-up with plastic surgery and Gen. Surgery. Also encouraged him to follow-up with his primary care provider. I encouraged him to seek surgical removal. I advised the patient to follow-up with their primary care provider this week. I advised the patient to return to the emergency department with new or worsening symptoms or new concerns. The patient verbalized understanding and agreement with plan.    I personally performed the services described in this documentation, which was scribed in my presence. The recorded information has been reviewed and is accurate.      Everlene Farrier, PA-C 01/04/15 1832  Pricilla Loveless, MD 01/05/15 [phone]

## 2015-01-04 NOTE — ED Notes (Signed)
Pt states that he has had keloids on his ears x 2 months after getting his ears gauged. States they are very painful. Alert and oriented.

## 2015-01-04 NOTE — Discharge Instructions (Signed)
KELOID  What is a keloid? -- A keloid is a growth that happens where the skin has healed from a cut or other injury. It can be painful and itchy, and look different from a normal scar. A person can have just one keloid or many. Keloids are more common in people with dark skin. They can also run in families. If someone in your family gets keloids, you might be more likely to get them. Keloids can form after any injury or procedure that breaks the skin. This can include: ?Piercing - This can be of the ears or another body part. ?A cut that gets infected or leaves a scar ?Acne, after the pimples heal ?Surgery - A keloid can form in the scar. What are the symptoms of a keloid? -- A keloid can look like a lumpy growth (picture 1). It can: ?Itch ?Hurt when you press on it ?Cause sharp, shooting pain Keloids are most common on certain body parts. These include: ?Ears ?Neck - One type of keloid happens after a pimple heals on the back of the neck. ?Jaw ?Chest, shoulders, and upper back Should I see a doctor or nurse? -- Yes. If you have a new growth on your skin, see your doctor or nurse. Keloids are not cancer. But some skin growths are cancer. Ask your doctor or nurse to check. Is there a test for keloids? -- No. There is no test. But your doctor or nurse should be able to tell if you have a keloid by looking at it and learning about your symptoms. How are keloids treated? -- Doctors have several different treatments for keloids. Possible treatments include: ?Giving an injection (shot) of medicine in the keloid - This can make it flatten out. Doctors can try different medicines to see which one works best. ?Doing surgery to take the keloid off - After surgery, the doctor usually gives a shot of medicine in the area. This can help keep the keloid from coming back. ?Putting a sheet of sticky material over the keloid - This might make the keloid hurt or itch less. ?Cryosurgery - In this procedure, the  doctor uses a very cold liquid to freeze the keloid. ?Pressure earrings - These are special earrings that press on the holes after you get your ears pierced. They could help stop keloids from forming when the holes heal. ?Radiation - This treatment uses high doses of X-rays to destroy keloids. ?Laser treatment - This treatment uses strong light to destroy keloids. ?Putting a prescription cream on the area - The doctor might prescribe this to use after surgery. Keloids can be hard to treat. They often come back after treatment. Giving more than one treatment at a time, such as a shot of medicine after surgery, can work better than just one treatment.  Can keloids be prevented? -- Yes. You can lower your chances of getting keloids by: ?Not having surgery or procedures that break the skin, if possible. For example, you can: Avoid getting your ears or other body parts pierced  Avoid having surgery to take off a mole (unless your doctor or nurse says you need surgery) ?Treating acne or a cut right away. ?NOT shaving your neck - This can irritate pimples and make them form keloids after they heal. If you have a cut or scar, you can: ?Keep the cut or wound moist while it heals - You can put a thin layer of petroleum jelly on it. Cover it with a bandage or gauze. Keep  the bandage clean and dry. ?Cover scars when you are in the sun - Do this for 3 months after the scar forms. You can also use sunscreen on the scar.

## 2017-01-25 ENCOUNTER — Emergency Department (HOSPITAL_COMMUNITY): Payer: Self-pay

## 2017-01-25 ENCOUNTER — Observation Stay (HOSPITAL_COMMUNITY)
Admission: EM | Admit: 2017-01-25 | Discharge: 2017-01-25 | Discharge status: Against Medical Advice | Payer: Self-pay | Attending: Family Medicine | Admitting: Family Medicine

## 2017-01-25 ENCOUNTER — Encounter (HOSPITAL_COMMUNITY): Payer: Self-pay | Admitting: Emergency Medicine

## 2017-01-25 DIAGNOSIS — R631 Polydipsia: Secondary | ICD-10-CM | POA: Insufficient documentation

## 2017-01-25 DIAGNOSIS — K760 Fatty (change of) liver, not elsewhere classified: Secondary | ICD-10-CM | POA: Insufficient documentation

## 2017-01-25 DIAGNOSIS — F1910 Other psychoactive substance abuse, uncomplicated: Secondary | ICD-10-CM

## 2017-01-25 DIAGNOSIS — G8929 Other chronic pain: Secondary | ICD-10-CM | POA: Insufficient documentation

## 2017-01-25 DIAGNOSIS — F1110 Opioid abuse, uncomplicated: Secondary | ICD-10-CM | POA: Insufficient documentation

## 2017-01-25 DIAGNOSIS — R000 Tachycardia, unspecified: Principal | ICD-10-CM | POA: Diagnosis present

## 2017-01-25 LAB — CBC WITH DIFFERENTIAL/PLATELET
Basophils Absolute: 0 10*3/uL (ref 0.0–0.1)
Basophils Relative: 0 %
Eosinophils Absolute: 0.3 10*3/uL (ref 0.0–0.7)
Eosinophils Relative: 2 %
HCT: 40.9 % (ref 39.0–52.0)
Hemoglobin: 14.5 g/dL (ref 13.0–17.0)
Lymphocytes Relative: 20 %
Lymphs Abs: 2.3 10*3/uL (ref 0.7–4.0)
MCH: 28.3 pg (ref 26.0–34.0)
MCHC: 35.5 g/dL (ref 30.0–36.0)
MCV: 79.9 fL (ref 78.0–100.0)
Monocytes Absolute: 0.9 10*3/uL (ref 0.1–1.0)
Monocytes Relative: 7 %
Neutro Abs: 8.2 10*3/uL — ABNORMAL HIGH (ref 1.7–7.7)
Neutrophils Relative %: 71 %
PLATELETS: 280 10*3/uL (ref 150–400)
RBC: 5.12 MIL/uL (ref 4.22–5.81)
RDW: 12 % (ref 11.5–15.5)
WBC: 11.7 10*3/uL — AB (ref 4.0–10.5)

## 2017-01-25 LAB — COMPREHENSIVE METABOLIC PANEL
ALK PHOS: 66 U/L (ref 38–126)
ALT: 72 U/L — ABNORMAL HIGH (ref 17–63)
AST: 50 U/L — ABNORMAL HIGH (ref 15–41)
Albumin: 4.1 g/dL (ref 3.5–5.0)
Anion gap: 9 (ref 5–15)
BUN: 8 mg/dL (ref 6–20)
CHLORIDE: 99 mmol/L — AB (ref 101–111)
CO2: 28 mmol/L (ref 22–32)
CREATININE: 1.08 mg/dL (ref 0.61–1.24)
Calcium: 9.2 mg/dL (ref 8.9–10.3)
GLUCOSE: 151 mg/dL — AB (ref 65–99)
Potassium: 3.9 mmol/L (ref 3.5–5.1)
Sodium: 136 mmol/L (ref 135–145)
Total Bilirubin: 0.9 mg/dL (ref 0.3–1.2)
Total Protein: 7.3 g/dL (ref 6.5–8.1)

## 2017-01-25 LAB — ETHANOL: Alcohol, Ethyl (B): <5 mg/dL (ref <5)

## 2017-01-25 LAB — BRAIN NATRIURETIC PEPTIDE: B Natriuretic Peptide: 10 pg/mL (ref 0.0–100.0)

## 2017-01-25 LAB — RAPID URINE DRUG SCREEN, HOSP PERFORMED
Amphetamines: NOT DETECTED
Barbiturates: NOT DETECTED
Benzodiazepines: NOT DETECTED
COCAINE: NOT DETECTED
Opiates: POSITIVE — AB
Tetrahydrocannabinol: NOT DETECTED

## 2017-01-25 LAB — TSH: TSH: 0.51 u[IU]/mL (ref 0.350–4.500)

## 2017-01-25 LAB — D-DIMER, QUANTITATIVE: D-Dimer, Quant: <0.27 ug/mL-FEU (ref 0.00–0.50)

## 2017-01-25 LAB — TROPONIN I

## 2017-01-25 MED ORDER — DILTIAZEM HCL 100 MG IV SOLR
5.0000 mg/h | INTRAVENOUS | Status: DC
  Administered 2017-01-25: 5 mg/h via INTRAVENOUS
  Filled 2017-01-25: qty 100

## 2017-01-25 MED ORDER — NITROGLYCERIN 0.4 MG SL SUBL: 0.4000 mg | SUBLINGUAL_TABLET | SUBLINGUAL | Status: DC | PRN

## 2017-01-25 MED ORDER — LORAZEPAM 2 MG/ML IJ SOLN
2.0000 mg | Freq: Once | INTRAMUSCULAR | Status: AC
  Administered 2017-01-25: 2 mg via INTRAVENOUS
  Filled 2017-01-25: qty 1

## 2017-01-25 MED ORDER — DILTIAZEM HCL 25 MG/5ML IV SOLN
15.0000 mg | Freq: Once | INTRAVENOUS | Status: AC
Start: 2017-01-25 — End: 2017-01-25
  Administered 2017-01-25: 15 mg via INTRAVENOUS
  Filled 2017-01-25: qty 5

## 2017-01-25 MED ORDER — IOPAMIDOL (ISOVUE-370) INJECTION 76%
INTRAVENOUS | Status: AC
  Administered 2017-01-25: 80 mL
  Filled 2017-01-25: qty 100

## 2017-01-25 MED ORDER — MORPHINE SULFATE (PF) 4 MG/ML IV SOLN: 2.0000 mg | INTRAVENOUS | Status: DC | PRN

## 2017-01-25 MED ORDER — ONDANSETRON HCL 4 MG/2ML IJ SOLN: 4.0000 mg | Freq: Four times a day (QID) | INTRAMUSCULAR | Status: DC | PRN

## 2017-01-25 MED ORDER — ACETAMINOPHEN 325 MG PO TABS
650.0000 mg | ORAL_TABLET | ORAL | Status: DC | PRN
  Administered 2017-01-25: 650 mg via ORAL
  Filled 2017-01-25: qty 2

## 2017-01-25 MED ORDER — HEPARIN SODIUM (PORCINE) 5000 UNIT/ML IJ SOLN: 5000.0000 [IU] | Freq: Three times a day (TID) | INTRAMUSCULAR | Status: DC

## 2017-01-25 MED ORDER — SODIUM CHLORIDE 0.9 % IV BOLUS (SEPSIS)
1000.0000 mL | Freq: Once | INTRAVENOUS | Status: AC
  Administered 2017-01-25: 1000 mL via INTRAVENOUS

## 2017-01-25 MED ORDER — DILTIAZEM HCL 25 MG/5ML IV SOLN
25.0000 mg | Freq: Once | INTRAVENOUS | Status: AC
  Administered 2017-01-25: 25 mg via INTRAVENOUS
  Filled 2017-01-25: qty 5

## 2017-01-25 NOTE — ED Notes (Signed)
Pt's girlfriend Victorino Dike) left phone number: [phone]

## 2017-01-25 NOTE — Progress Notes (Signed)
Patient off tele. All I.V."s out and patient D.C. with belongings. Walk him to the [employer]

## 2017-01-25 NOTE — Progress Notes (Signed)
Dr Minerva Areola paged and call return and aware that patient is leaving AMA. No orders given M.[place]

## 2017-01-25 NOTE — Progress Notes (Signed)
Patient has sign AMA papers

## 2017-01-25 NOTE — ED Notes (Signed)
Pt's sister Lelon Mast) and mother called for updates: [phone] Pt's mother Jasmine December): [phone] Want to set up passcode "Hayley" for staff to share information, pt aware

## 2017-01-25 NOTE — ED Notes (Signed)
Patient transported to CT 

## 2017-01-25 NOTE — ED Triage Notes (Signed)
Patient snorted Heroin this evening reports excessive thirst with palpitations and mild SOB .

## 2017-01-25 NOTE — Progress Notes (Signed)
Re page Collin Beasley on call to inform patient is leaving AMA. Awaiting return call

## 2017-01-25 NOTE — ED Notes (Signed)
Pt asking for water while checking, this RN asked pt to wait until he was triaged and then we would see if he could have water. Pt stated "ill just pass out then ill get some water"

## 2017-01-25 NOTE — ED Notes (Signed)
Attempted report and an RN named Dois Davenport advised the bed was not approved however it had been assigned for 38 minutes. Charge RN Johnston Ebbs made aware.

## 2017-01-25 NOTE — H&P (Signed)
[facility] History and Physical  Collin Beasley ZOX:[id number] DOB: 1985-09-13 DOA: 01/25/2017  Referring physician:  PCP: Patient, No Pcp Per   Chief Complaint: Chest discomfort  HPI: Collin Beasley is a 31 y.o. male  history of drug abuse and chronic back pain. Patient states that he snorted a pill he thought was Percocet. He says the high was abnormal. Patient did had onset of chest discomfort. This resolved. Patient very diaphoretic. Made his way to emergency room for evaluation.  ED course: CTA chest checked and no PE seen. Patient given IV fluids. Hospitalist consulted for admission.   Review of Systems:  As per HPI otherwise 10 point review of systems negative.    Past Medical History:  Diagnosis Date  . Chronic back pain    tattoo artist  . Heroin abuse    Past Surgical History:  Procedure Laterality Date  . NO PAST SURGERIES     Social History:  reports that he has never smoked. He has never used smokeless tobacco. He reports that he uses drugs, including Other-see comments and Cocaine. He reports that he does not drink alcohol.  No Known Allergies  Family History  Problem Relation Age of Onset  . Heart attack Neg Hx   . Heart failure Neg Hx   . Hypertension Neg Hx   . Stroke Neg Hx   . Sudden death Neg Hx      Prior to Admission medications   Not on File   Physical Exam: Vitals:   01/25/17 0930 01/25/17 0945 01/25/17 1000 01/25/17 1015  BP: (!) 115/55 125/80 134/86 134/78  Pulse: (!) 128 (!) 127 (!) 142 (!) 139  Resp: 18 (!) 22 19 16   Temp:      TempSrc:      SpO2: 91% 92% 91% 93%    Wt Readings from Last 3 Encounters:  10/09/14 104.3 kg (230 lb)  05/12/14 105.6 kg (232 lb 12.8 oz)  05/05/14 95.3 kg (210 lb)    General:  Appears calm and comfortable; A&Ox3 Eyes:  PERRL, EOMI, normal lids, iris ENT:  grossly normal hearing, lips & tongue Neck:  no LAD, masses or thyromegaly Cardiovascular:  Tachy, RR, no m/r/g. No LE edema.    Respiratory:  CTA bilaterally, no w/r/r. Normal respiratory effort. Abdomen:  soft, ntnd Skin:  no rash or induration seen on limited exam Musculoskeletal:  grossly normal tone BUE/BLE Psychiatric:  grossly normal mood and affect, speech fluent and appropriate Neurologic:  CN 2-12 grossly intact, moves all extremities in coordinated fashion.          Labs on Admission:  Basic Metabolic Panel:  Recent Labs Lab 01/25/17 0128  NA 136  K 3.9  CL 99*  CO2 28  GLUCOSE 151*  BUN 8  CREATININE 1.08  CALCIUM 9.2   Liver Function Tests:  Recent Labs Lab 01/25/17 0128  AST 50*  ALT 72*  ALKPHOS 66  BILITOT 0.9  PROT 7.3  ALBUMIN 4.1   No results for input(s): LIPASE, AMYLASE in the last 168 hours. No results for input(s): AMMONIA in the last 168 hours. CBC:  Recent Labs Lab 01/25/17 0128  WBC 11.7*  NEUTROABS 8.2*  HGB 14.5  HCT 40.9  MCV 79.9  PLT 280   Cardiac Enzymes: No results for input(s): CKTOTAL, CKMB, CKMBINDEX, TROPONINI in the last 168 hours.  BNP (last 3 results)  Recent Labs  01/25/17 0128  BNP 10.0    ProBNP (last 3 results) No results for input(s): PROBNP  in the last 8760 hours.   Creatinine clearance cannot be calculated (Unknown ideal weight.)  CBG: No results for input(s): GLUCAP in the last 168 hours.  Radiological Exams on Admission: Ct Angio Chest Pe W And/or Wo Contrast  Result Date: 01/25/2017 CLINICAL DATA:  Shortness of breath. EXAM: CT ANGIOGRAPHY CHEST WITH CONTRAST TECHNIQUE: Multidetector CT imaging of the chest was performed using the standard protocol during bolus administration of intravenous contrast. Multiplanar CT image reconstructions and MIPs were obtained to evaluate the vascular anatomy. CONTRAST:  80 cc Isovue 370 intravenous contrast. COMPARISON:  Chest x-ray from same date. FINDINGS: Cardiovascular: Satisfactory opacification of the pulmonary arteries to the segmental level. No evidence of pulmonary embolism.  Normal heart size. No pericardial effusion. Normal thoracic aorta. Mediastinum/Nodes: No enlarged mediastinal, hilar, or axillary lymph nodes. Thyroid gland, trachea, and esophagus demonstrate no significant findings. Lungs/Pleura: Patchy areas of atelectasis throughout both lungs. No suspicious pulmonary nodule. No pneumothorax or pleural effusion. Upper Abdomen: No acute abnormality.  Hepatic steatosis. Musculoskeletal: No chest wall abnormality. No acute or significant osseous findings. Review of the MIP images confirms the above findings. IMPRESSION: 1. No evidence of pulmonary embolism. No acute intrathoracic process. 2. Hepatic steatosis. Electronically Signed   By: Obie Dredge M.D.   On: 01/25/2017 09:05   Dg Chest Port 1 View  Result Date: 01/25/2017 CLINICAL DATA:  Acute onset of shortness of breath, excessive thirst and palpitations. Recently snorted heroin. Initial encounter. EXAM: PORTABLE CHEST 1 VIEW COMPARISON:  Chest radiograph performed 05/05/2014 FINDINGS: The lungs are well-aerated and clear. There is no evidence of focal opacification, pleural effusion or pneumothorax. The cardiomediastinal silhouette is within normal limits. No acute osseous abnormalities are seen. IMPRESSION: No acute cardiopulmonary process seen. Electronically Signed   By: Roanna Raider M.D.   On: 01/25/2017 02:32    EKG: Independently reviewed. Sinus tach.  Assessment/Plan Active Problems:   Sinus tachycardia  Tachy 2/2 drugs, CP - serial trop ordered, initial neg - prn EKG CP - prn moprhine CP - prn ntg cp - asa in ED and QD - echo ordered for AM - tele bed, cardiac monitoring - zofran prn for nausea - cardizem drip  Drug abuse UDS pos for opioids Advised cessation  Code Status: FC  DVT Prophylaxis: heparin Family Communication: none Disposition Plan: Pending Improvement  Status: obs tele  Haydee Salter, MD Family Medicine [facility] [email or web address] Password [email or web address]

## 2017-01-25 NOTE — ED Notes (Addendum)
Dr. Ranae Palms notified on pt.'s EKG/HR= 162/min at triage . Charge nurse will facilitate room assignment .

## 2017-01-25 NOTE — Progress Notes (Addendum)
Patient wanting to leave and go see his daughter. Girlfriend is coming to pick him up. He does  not want anything to help relax him. Explain the risks involved if left and how it important it is to get his heart rate under control. Girl friend is coming to pick him up. Paged M.D. On call awaiting return call.  H.R. S.T. 120's at rest and 130's with activity. Patient pulled his I.V. out

## 2017-01-25 NOTE — Progress Notes (Signed)
Shelly R.N. House coverage aware patient is leaving AMA

## 2017-01-26 LAB — HIV ANTIBODY (ROUTINE TESTING W REFLEX): HIV Screen 4th Generation wRfx: NONREACTIVE

## 2017-01-26 NOTE — ED Provider Notes (Signed)
Patient was observed for a period of time while awaiting results of workup. CT scan showed no evidence of pulmonary embolism.  Patient continued to have tachycardia with a rate persistently in the 140s. Patient did have some improvement after fluids and Ativan however, he continued to bounce back up in rate. Suspect tachycardia secondary to the effects of the unknown snorted drug. Given his persistent symptoms, patient will be admitted for further management.  Patient admitted in stable condition.   Clinical Impression: 1. Substance abuse   2. Sinus tachycardia     Disposition: Admit to Hospitalist service    Elliett Guarisco, Canary Brim, MD 01/26/17 [phone]

## 2017-04-06 ENCOUNTER — Encounter (HOSPITAL_COMMUNITY): Payer: Self-pay | Admitting: Emergency Medicine

## 2017-04-06 ENCOUNTER — Emergency Department (HOSPITAL_COMMUNITY): Payer: Self-pay

## 2017-04-06 ENCOUNTER — Emergency Department (HOSPITAL_COMMUNITY)
Admission: EM | Admit: 2017-04-06 | Discharge: 2017-04-07 | Disposition: A | Discharge status: Home / Self Care | Payer: Self-pay | Attending: Emergency Medicine | Admitting: Emergency Medicine

## 2017-04-06 DIAGNOSIS — F1910 Other psychoactive substance abuse, uncomplicated: Secondary | ICD-10-CM | POA: Insufficient documentation

## 2017-04-06 DIAGNOSIS — F410 Panic disorder [episodic paroxysmal anxiety] without agoraphobia: Secondary | ICD-10-CM | POA: Insufficient documentation

## 2017-04-06 LAB — BASIC METABOLIC PANEL
ANION GAP: 10 (ref 5–15)
BUN: 7 mg/dL (ref 6–20)
CHLORIDE: 99 mmol/L — AB (ref 101–111)
CO2: 27 mmol/L (ref 22–32)
Calcium: 9.4 mg/dL (ref 8.9–10.3)
Creatinine, Ser: 1.16 mg/dL (ref 0.61–1.24)
GFR calc non Af Amer: >60 mL/min (ref >60)
Glucose, Bld: 142 mg/dL — ABNORMAL HIGH (ref 65–99)
POTASSIUM: 4.7 mmol/L (ref 3.5–5.1)
SODIUM: 136 mmol/L (ref 135–145)

## 2017-04-06 LAB — CBC
HEMATOCRIT: 44.2 % (ref 39.0–52.0)
HEMOGLOBIN: 15.5 g/dL (ref 13.0–17.0)
MCH: 28.8 pg (ref 26.0–34.0)
MCHC: 35.1 g/dL (ref 30.0–36.0)
MCV: 82 fL (ref 78.0–100.0)
Platelets: 267 10*3/uL (ref 150–400)
RBC: 5.39 MIL/uL (ref 4.22–5.81)
RDW: 12.2 % (ref 11.5–15.5)
WBC: 11.3 10*3/uL — AB (ref 4.0–10.5)

## 2017-04-06 LAB — I-STAT TROPONIN, ED: Troponin i, poc: 0 ng/mL (ref 0.00–0.08)

## 2017-04-06 MED ORDER — SODIUM CHLORIDE 0.9 % IV BOLUS (SEPSIS)
1000.0000 mL | Freq: Once | INTRAVENOUS | Status: AC
Start: 2017-04-06 — End: 2017-04-06
  Administered 2017-04-06: 1000 mL via INTRAVENOUS

## 2017-04-06 MED ORDER — ONDANSETRON HCL 4 MG/2ML IJ SOLN
4.0000 mg | Freq: Once | INTRAMUSCULAR | Status: AC
  Administered 2017-04-07: 4 mg via INTRAVENOUS
  Filled 2017-04-06: qty 2

## 2017-04-06 MED ORDER — SODIUM CHLORIDE 0.9 % IV BOLUS (SEPSIS)
1000.0000 mL | Freq: Once | INTRAVENOUS | Status: AC
  Administered 2017-04-07: 1000 mL via INTRAVENOUS

## 2017-04-06 MED ORDER — ACETAMINOPHEN 500 MG PO TABS
1000.0000 mg | ORAL_TABLET | Freq: Once | ORAL | Status: AC
  Administered 2017-04-06: 1000 mg via ORAL
  Filled 2017-04-06: qty 2

## 2017-04-06 NOTE — ED Notes (Signed)
Every time this pt stands to urinate his heart rate goes ST around 140s

## 2017-04-06 NOTE — ED Provider Notes (Signed)
[facility]   CSN: [id number] Arrival date & time: 04/06/17  1347  History   Chief Complaint Chief Complaint  Patient presents with  . Addiction Problem  . Palpitations  . Panic Attack   HPI Collin Beasley is a 31 y.o. male with PMH of polysubstance abuse presents with heart palpitations and anxiety after snorting what he says is an unknown substance.  Patient states symptoms shortly after this.  Patient states that he use happened approximately 1-2 hours ago.  Denies any associated chest pain or shortness of breath.  Has not had any nausea or vomiting.  States that he feels weak and lightheaded.  HPI  Past Medical History:  Diagnosis Date  . Chronic back pain    tattoo artist  . Heroin abuse [facility](HCC)    Patient Active Problem List   Diagnosis Date Noted  . Sinus tachycardia 01/25/2017   Past Surgical History:  Procedure Laterality Date  . NO PAST SURGERIES    . WISDOM TOOTH EXTRACTION      Home Medications    Prior to Admission medications   Not on File   Family History Family History  Problem Relation Age of Onset  . Heart attack Neg Hx   . Heart failure Neg Hx   . Hypertension Neg Hx   . Stroke Neg Hx   . Sudden death Neg Hx    Social History Social History  Substance Use Topics  . Smoking status: Never Smoker  . Smokeless tobacco: Never Used  . Alcohol use No   Allergies   Patient has no known allergies.  Review of Systems Review of Systems  Constitutional: Negative for chills and fever.  HENT: Negative for ear pain and sore throat.   Eyes: Negative for pain and visual disturbance.  Respiratory: Negative for cough and shortness of breath.   Cardiovascular: Positive for palpitations. Negative for chest pain.  Gastrointestinal: Negative for abdominal pain and vomiting.  Genitourinary: Negative for dysuria and hematuria.  Musculoskeletal: Negative for arthralgias and back pain.  Skin: Negative for  color change and rash.  Neurological: Positive for dizziness, weakness and light-headedness. Negative for seizures and syncope.  Psychiatric/Behavioral: The patient is nervous/anxious.   All other systems reviewed and are negative.  Physical Exam Updated Vital Signs BP (!) 149/82   Pulse (!) 164   Temp 98.4 F (36.9 C) (Oral)   Resp 20   Ht 6\' 4"  (1.93 m)   Wt 130.6 kg (288 lb)   SpO2 95%   BMI 35.06 kg/m   Physical Exam  Constitutional: He is oriented to person, place, and time. He appears well-developed and well-nourished.  HENT:  Head: Normocephalic and atraumatic.  Eyes: Pupils are equal, round, and reactive to light. Conjunctivae and EOM are normal.  Neck: Normal range of motion. Neck supple.  Cardiovascular: Regular rhythm.  Tachycardia present.   No murmur heard. Pulmonary/Chest: Effort normal and breath sounds normal. No respiratory distress.  Abdominal: Soft. Bowel sounds are normal. He exhibits no distension. There is no tenderness.  Musculoskeletal: Normal range of motion. He exhibits no edema or tenderness.  Neurological: He is oriented to person, place, and time. No cranial nerve deficit or sensory deficit. He exhibits normal muscle tone. Coordination normal.  Somnolent but easily arousable and answering questions appropriately  Skin: Skin is warm and dry.  Psychiatric: He has a normal mood and affect.  Nursing note and vitals reviewed.  ED Treatments / Results  Labs (all  labs ordered are listed, but only abnormal results are displayed) Labs Reviewed  BASIC METABOLIC PANEL - Abnormal; Notable for the following:       Result Value   Chloride 99 (*)    Glucose, Bld 142 (*)    All other components within normal limits  CBC - Abnormal; Notable for the following:    WBC 11.3 (*)    All other components within normal limits  I-STAT TROPONIN, ED   EKG  EKG Interpretation  Date/Time:  Saturday April 06 2017 14:08:05 EDT Ventricular Rate:  158 PR  Interval:    QRS Duration: 82 QT Interval:  316 QTC Calculation: 512 R Axis:   93 Text Interpretation:  Sinus tachycardia Rightward axis Nonspecific ST abnormality Abnormal ECG Confirmed by Blane Ohara [phone]) on 04/06/2017 5:39:01 PM      Radiology Dg Chest 2 View  Result Date: 04/06/2017 CLINICAL DATA:  Palpitations shortness of breath, dizziness. EXAM: CHEST  2 VIEW COMPARISON:  Chest x-ray dated 01/25/2017. FINDINGS: The heart size and mediastinal contours are within normal limits. Both lungs are clear. The visualized skeletal structures are unremarkable. IMPRESSION: No active cardiopulmonary disease. No evidence of pneumonia or pulmonary edema. Electronically Signed   By: Bary Richard M.D.   On: 04/06/2017 14:39   Procedures Procedures (including critical care time)  Medications Ordered in ED Medications - No data to display  Initial Impression / Assessment and Plan / ED Course  I have reviewed the triage vital signs and the nursing notes.  Pertinent labs & imaging results that were available during my care of the patient were reviewed by me and considered in my medical decision making (see chart for details).  Collin Beasley is a 31 year old male with history of polysubstance abuse who presents with palpitations and anxiety after snorting what he states was an unknown substance.  On arrival patient was tachycardic and hypertensive.  My assessment he appeared somnolent but easily arousable answering questions properly.  Patient given IV fluids with improvement of vital signs and observed in the emergency department for several hours.   Upon my reassessment patient complained of some nausea and was given Zofran. Patient significant much more alert and safe and stable for discharge with improvement of vital signs.  Advised patient to refrain from using heroin, methamphetamines and any other illegal substances.  Given strict return precautions. Discharged in stable  condition.  Final Clinical Impressions(s) / ED Diagnoses   Final diagnoses:  Polysubstance abuse (HCC)  Panic attack    New Prescriptions New Prescriptions   No medications on file     Lamont Snowball, MD 04/07/17 2359    Blane Ohara, MD 04/09/17 [phone]

## 2017-04-06 NOTE — ED Triage Notes (Signed)
Pt c/o using heroin recently and an hour after use began to have palpitations and panic attacks.

## 2017-04-06 NOTE — ED Notes (Signed)
Pt needs to eat, drink and walk and then re-evaluate for d/c home.

## 2017-04-06 NOTE — ED Notes (Signed)
Every time pt stands up the pt's HR goes up to 140s sinus tach. Provider aware.

## 2017-04-06 NOTE — ED Notes (Signed)
Pt given sandwich bag and sprite to drink.

## 2017-04-06 NOTE — ED Notes (Signed)
Pt ambulated to the restroom independently 

## 2018-02-07 IMAGING — CT CT ANGIO CHEST
2 of 6 series · 19 of 36 positions shown · IV contrast (Omni 300)
Comparison: Chest x-ray from same date.

CLINICAL DATA: Shortness of breath.

EXAM:
CT ANGIOGRAPHY CHEST WITH CONTRAST
TECHNIQUE: Multidetector CT imaging of the chest was performed using the
standard protocol during bolus administration of intravenous
contrast. Multiplanar CT image reconstructions and MIPs were
obtained to evaluate the vascular anatomy.
CONTRAST:  80 cc Isovue 370 intravenous contrast.

[Series 7: pe thins · axial · 0.75mm/px · z∈[-816,-551]mm · 18 of 417 slices shown]
[im 19/417  lung]
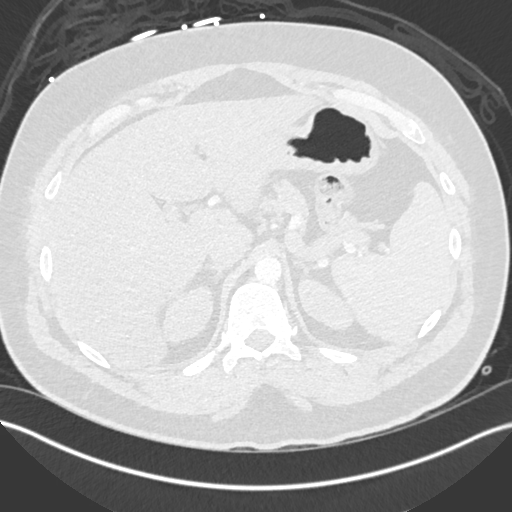
[im 38/417  mediastinal]
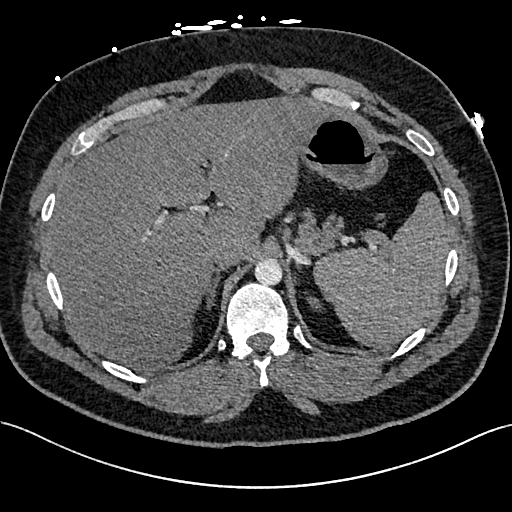
[im 57/417  lung]
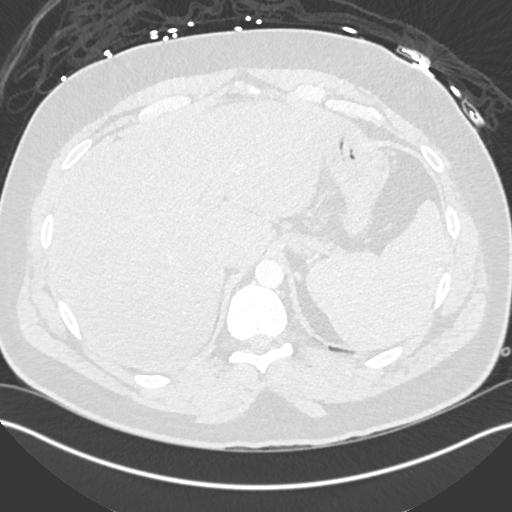
[im 95/417  mediastinal]
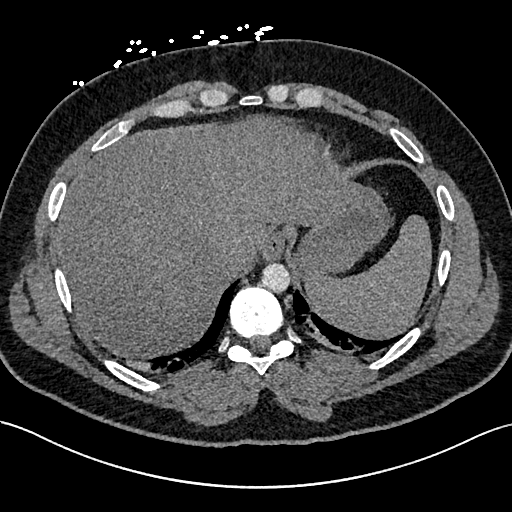
[im 114/417  lung]
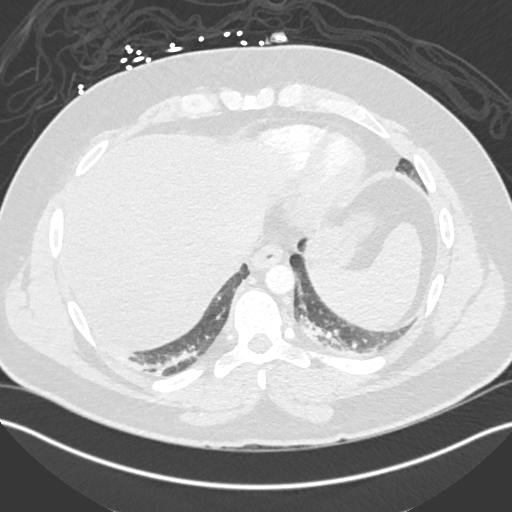
[im 133/417  mediastinal]
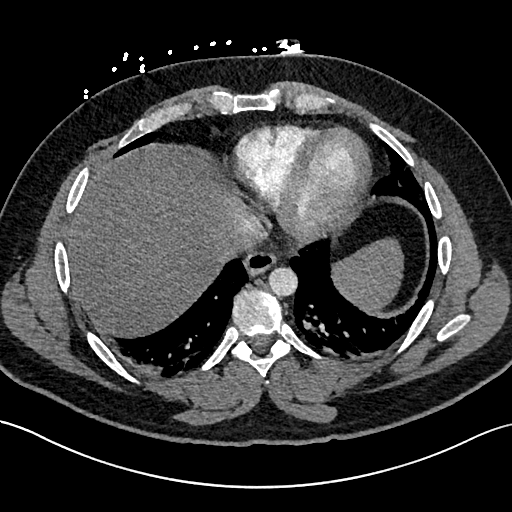
[im 152/417  lung]
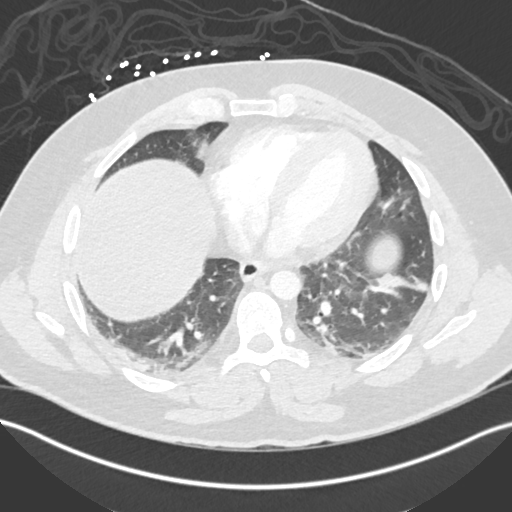
[im 171/417  mediastinal]
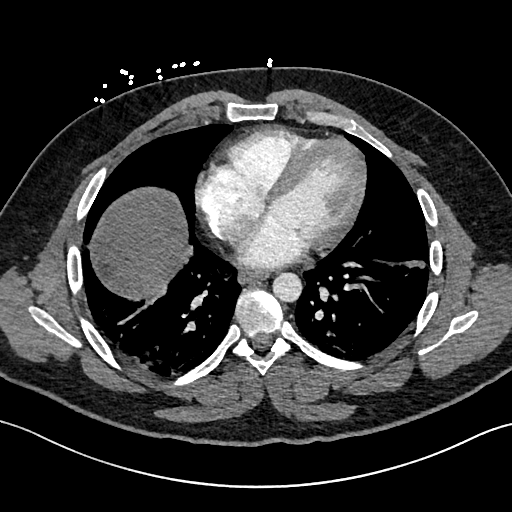
[im 190/417  lung]
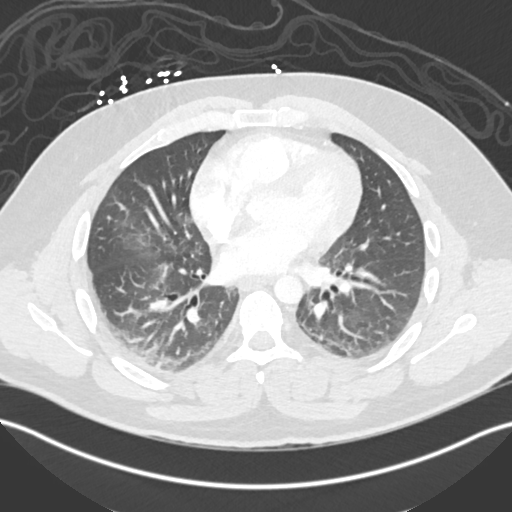
[im 227/417  mediastinal]
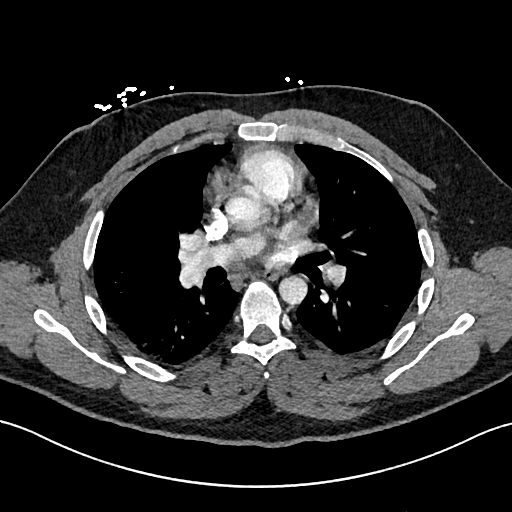
[im 246/417  lung]
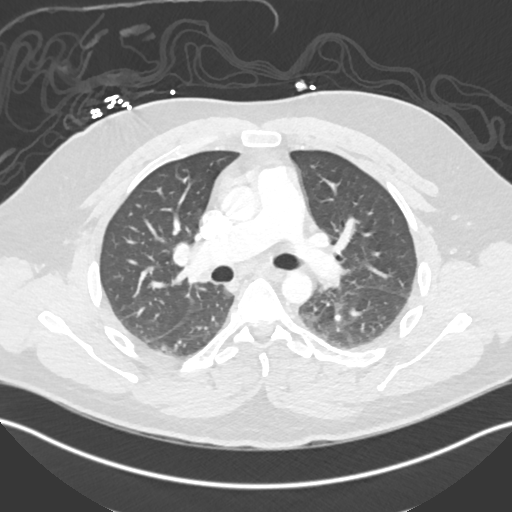
[im 265/417  mediastinal]
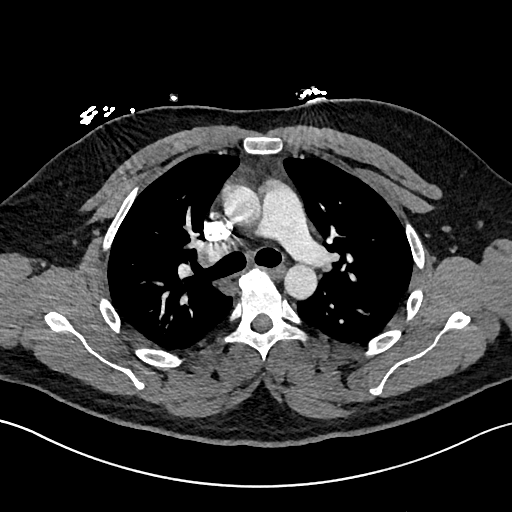
[im 284/417  lung]
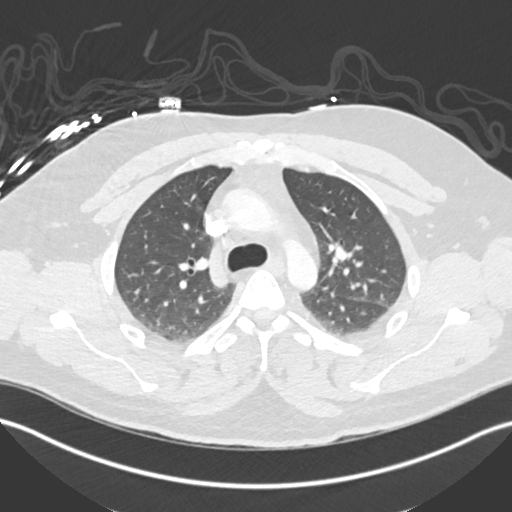
[im 303/417  mediastinal]
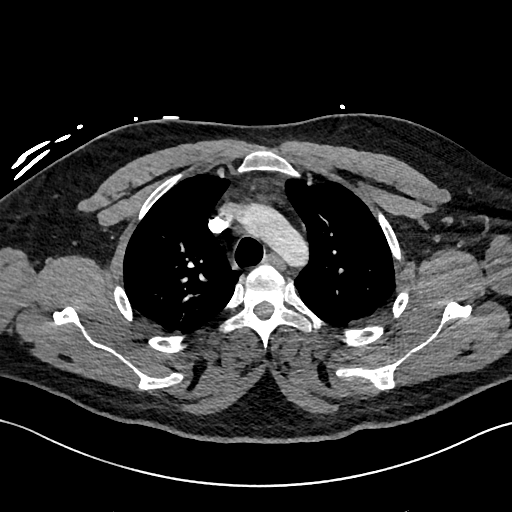
[im 322/417  lung]
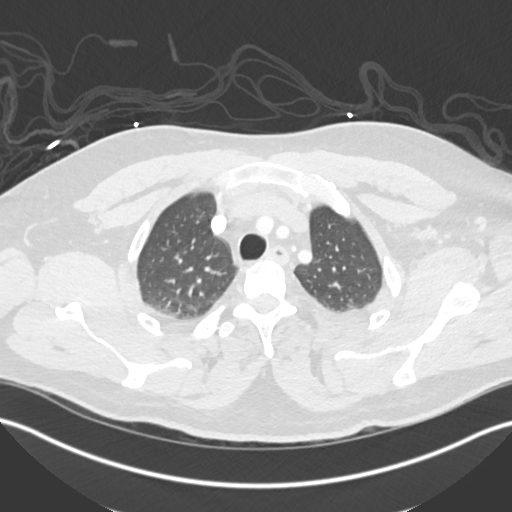
[im 360/417  mediastinal]
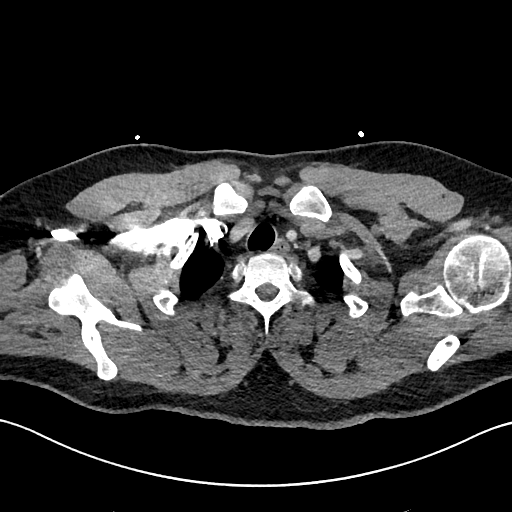
[im 379/417  lung]
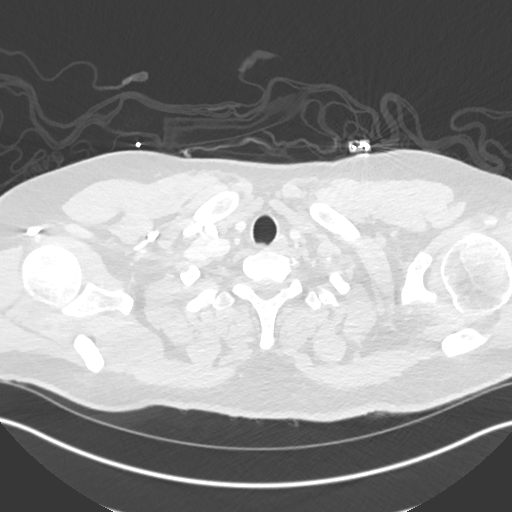
[im 398/417  mediastinal]
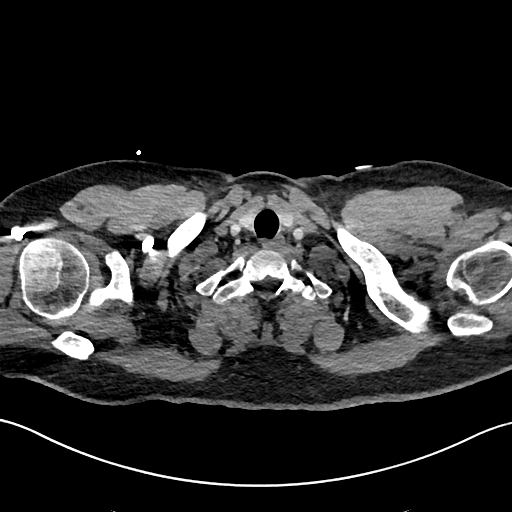

[Series 8: pe 2mm cor · coronal · 0.60mm/px · 1 of 80 slices shown]
[im 40/80  mediastinal]
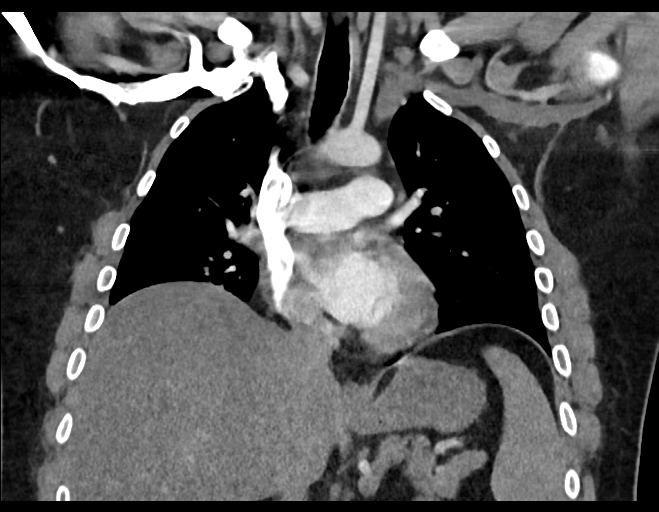

[19 of 36 positions shown; findings below may reference images not displayed]

FINDINGS: Cardiovascular: Satisfactory opacification of the pulmonary arteries
to the segmental level. No evidence of pulmonary embolism. Normal
heart size. No pericardial effusion. Normal thoracic aorta.

Mediastinum/Nodes: No enlarged mediastinal, hilar, or axillary lymph
nodes. Thyroid gland, trachea, and esophagus demonstrate no
significant findings.

Lungs/Pleura: Patchy areas of atelectasis throughout both lungs. No
suspicious pulmonary nodule. No pneumothorax or pleural effusion.

Upper Abdomen: No acute abnormality.  Hepatic steatosis.

Musculoskeletal: No chest wall abnormality. No acute or significant
osseous findings.

Review of the MIP images confirms the above findings.
IMPRESSION: 1. No evidence of pulmonary embolism. No acute intrathoracic
process.
2. Hepatic steatosis.
